# Patient Record
Sex: Female | Born: 1995
Health system: Southern US, Community
[De-identification: ages and names within clinical notes are randomized; demographics above are authoritative.]

## PROBLEM LIST (undated history)

## (undated) DIAGNOSIS — F4310 Post-traumatic stress disorder, unspecified: Secondary | ICD-10-CM

## (undated) DIAGNOSIS — R569 Unspecified convulsions: Secondary | ICD-10-CM

## (undated) DIAGNOSIS — F32A Depression, unspecified: Secondary | ICD-10-CM

## (undated) DIAGNOSIS — G43019 Migraine without aura, intractable, without status migrainosus: Secondary | ICD-10-CM

## (undated) DIAGNOSIS — F329 Major depressive disorder, single episode, unspecified: Secondary | ICD-10-CM

## (undated) HISTORY — DX: Post-traumatic stress disorder, unspecified: F43.10

## (undated) HISTORY — DX: Migraine without aura, intractable, without status migrainosus: G43.019

---

## 2016-08-18 ENCOUNTER — Emergency Department (HOSPITAL_COMMUNITY)
Admission: EM | Admit: 2016-08-18 | Discharge: 2016-08-19 | Disposition: A | Discharge status: Home / Self Care | Payer: Self-pay | Attending: Emergency Medicine | Admitting: Emergency Medicine

## 2016-08-18 ENCOUNTER — Encounter (HOSPITAL_COMMUNITY): Payer: Self-pay | Admitting: Emergency Medicine

## 2016-08-18 DIAGNOSIS — R0602 Shortness of breath: Secondary | ICD-10-CM | POA: Insufficient documentation

## 2016-08-18 DIAGNOSIS — R569 Unspecified convulsions: Secondary | ICD-10-CM | POA: Insufficient documentation

## 2016-08-18 DIAGNOSIS — R7989 Other specified abnormal findings of blood chemistry: Secondary | ICD-10-CM | POA: Insufficient documentation

## 2016-08-18 DIAGNOSIS — R000 Tachycardia, unspecified: Secondary | ICD-10-CM | POA: Insufficient documentation

## 2016-08-18 HISTORY — DX: Major depressive disorder, single episode, unspecified: F32.9

## 2016-08-18 HISTORY — DX: Depression, unspecified: F32.A

## 2016-08-18 LAB — RAPID URINE DRUG SCREEN, HOSP PERFORMED
Amphetamines: NOT DETECTED
Barbiturates: NOT DETECTED
Benzodiazepines: NOT DETECTED
Cocaine: NOT DETECTED
OPIATES: NOT DETECTED
Tetrahydrocannabinol: NOT DETECTED

## 2016-08-18 LAB — CBC WITH DIFFERENTIAL/PLATELET
Basophils Absolute: 0 10*3/uL (ref 0.0–0.1)
Basophils Relative: 0 %
Eosinophils Absolute: 0 10*3/uL (ref 0.0–0.7)
Eosinophils Relative: 0 %
HEMATOCRIT: 36.4 % (ref 36.0–46.0)
HEMOGLOBIN: 11.8 g/dL — AB (ref 12.0–15.0)
LYMPHS ABS: 1 10*3/uL (ref 0.7–4.0)
LYMPHS PCT: 10 %
MCH: 27.1 pg (ref 26.0–34.0)
MCHC: 32.4 g/dL (ref 30.0–36.0)
MCV: 83.7 fL (ref 78.0–100.0)
MONOS PCT: 7 %
Monocytes Absolute: 0.7 10*3/uL (ref 0.1–1.0)
NEUTROS ABS: 8.5 10*3/uL — AB (ref 1.7–7.7)
NEUTROS PCT: 83 %
Platelets: 174 10*3/uL (ref 150–400)
RBC: 4.35 MIL/uL (ref 3.87–5.11)
RDW: 13.4 % (ref 11.5–15.5)
WBC: 10.2 10*3/uL (ref 4.0–10.5)

## 2016-08-18 LAB — URINALYSIS, ROUTINE W REFLEX MICROSCOPIC
BACTERIA UA: NONE SEEN
BILIRUBIN URINE: NEGATIVE
Glucose, UA: NEGATIVE mg/dL
HGB URINE DIPSTICK: NEGATIVE
Ketones, ur: NEGATIVE mg/dL
LEUKOCYTES UA: NEGATIVE
NITRITE: NEGATIVE
PROTEIN: 100 mg/dL — AB
Specific Gravity, Urine: 1.024 (ref 1.005–1.030)
pH: 5 (ref 5.0–8.0)

## 2016-08-18 LAB — BASIC METABOLIC PANEL
Anion gap: 4 — ABNORMAL LOW (ref 5–15)
BUN: 13 mg/dL (ref 6–20)
CHLORIDE: 108 mmol/L (ref 101–111)
CO2: 23 mmol/L (ref 22–32)
CREATININE: 1.15 mg/dL — AB (ref 0.44–1.00)
Calcium: 8.6 mg/dL — ABNORMAL LOW (ref 8.9–10.3)
GFR calc non Af Amer: >60 mL/min (ref >60)
Glucose, Bld: 137 mg/dL — ABNORMAL HIGH (ref 65–99)
Potassium: 3.5 mmol/L (ref 3.5–5.1)
Sodium: 135 mmol/L (ref 135–145)

## 2016-08-18 LAB — PREGNANCY, URINE: Preg Test, Ur: NEGATIVE

## 2016-08-18 MED ORDER — SODIUM CHLORIDE 0.9 % IV BOLUS (SEPSIS)
1000.0000 mL | Freq: Once | INTRAVENOUS | Status: AC
Start: 2016-08-18 — End: 2016-08-18
  Administered 2016-08-18: 1000 mL via INTRAVENOUS

## 2016-08-18 NOTE — ED Provider Notes (Signed)
[facility]-EMERGENCY DEPT Provider Note   CSN: [id number] Arrival date & time: 08/18/16  2009     History   Chief Complaint Chief Complaint  Patient presents with  . Seizures    HPI Jacqueline Castaneda is a 21 y.o. female.  Patient with a history of depression presents with single tonic clonic seizure lasting approximately 5 minutes at about 7:15 tonight. There was a physician among the witnesses who confirmed seizure type and duration. No history of seizure. She does not feel she injured herself. She states her lip is sore but no tongue biting, vomiting or urinary incontinence. There was a post-ictal period per witnesses that has resolved. She reports lingering dizziness.    The history is provided by the patient. No language interpreter was used.  Seizures      Past Medical History:  Diagnosis Date  . Depression     There are no active problems to display for this patient.   History reviewed. No pertinent surgical history.  OB History    No data available       Home Medications    Prior to Admission medications   Not on File    Family History No family history on file.  Social History Social History  Substance Use Topics  . Smoking status: Never Smoker  . Smokeless tobacco: Never Used  . Alcohol use Yes     Comment: not weekly, very occasional     Allergies   Patient has no allergy information on record.   Review of Systems Review of Systems  Constitutional: Negative for chills and fever.  HENT: Negative.   Respiratory: Negative.   Cardiovascular: Negative.   Gastrointestinal: Negative.   Genitourinary: Negative for enuresis.  Musculoskeletal: Negative.   Skin: Negative.   Neurological: Positive for dizziness and seizures.     Physical Exam Updated Vital Signs There were no vitals taken for this visit.  Physical Exam  Constitutional: She is oriented to person, place, and time. She appears well-developed and well-nourished.  HENT:  Head:  Normocephalic.  Neck: Normal range of motion. Neck supple.  Cardiovascular: Normal rate and regular rhythm.   Pulmonary/Chest: Effort normal and breath sounds normal.  Abdominal: Soft. Bowel sounds are normal. There is no tenderness. There is no rebound and no guarding.  Musculoskeletal: Normal range of motion.  Neurological: She is alert and oriented to person, place, and time.  CN's 3-12 grossly intact. Speech is clear and focused. No facial asymmetry. No lateralizing weakness. Reflexes are equal. No deficits of coordination. Ambulatory without imbalance.    Skin: Skin is warm and dry. No rash noted.  Psychiatric: She has a normal mood and affect.     ED Treatments / Results  Labs (all labs ordered are listed, but only abnormal results are displayed) Labs Reviewed - No data to display  EKG  EKG Interpretation None       Radiology No results found.  Procedures Procedures (including critical care time)  Medications Ordered in ED Medications - No data to display   Initial Impression / Assessment and Plan / ED Course  I have reviewed the triage vital signs and the nursing notes.  Pertinent labs & imaging results that were available during my care of the patient were reviewed by me and considered in my medical decision making (see chart for details).     Patient presents with single seizure that occurred PTA with reported post-ictal period that has resolved.   Feel the likely source of seizure is  her use of Wellbutrin - no ETOH, drug use, h/o head injury or h/o seizures in the past. On Wellbutrin x 5 months. Discussed with pharmacist who advises that tapering to stop is not necessary.   She is given 2 liters fluids for soft blood pressure and tachycardia. She denies SOB or pain with breathing tonight. She does endorse periodic SOB. D-dimer slightly elevated. CT angio performed and is negative for PE.   She reports feeling better, less dizzy. She is ambulatory without  imbalance. No further seizure activity. She can be discharged home with return precautions. Will stop Wellbutrin.   Final Clinical Impressions(s) / ED Diagnoses   Final diagnoses:  None   1. Seizure 2. Tachycardia  New Prescriptions New Prescriptions   No medications on file     Elpidio AnisUpstill, Walter Min, Cordelia Poche-C 08/21/16 0017    Alvira MondaySchlossman, Erin, MD 08/21/16 [phone]
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
## 2016-08-19 ENCOUNTER — Emergency Department (HOSPITAL_COMMUNITY): Payer: Self-pay

## 2016-08-19 LAB — D-DIMER, QUANTITATIVE: D-Dimer, Quant: 0.81 ug/mL-FEU — ABNORMAL HIGH (ref 0.00–0.50)

## 2016-08-19 MED ORDER — IOPAMIDOL (ISOVUE-370) INJECTION 76%
INTRAVENOUS | Status: AC
  Administered 2016-08-19: 100 mL
  Filled 2016-08-19: qty 100

## 2016-08-19 NOTE — Discharge Instructions (Signed)
Your studies are negative for any worrisome findings. The seizure you experienced was most likely due to the medication you are taking (Wellbutrin). Stop taking this medication immediately. Follow up with [facility] - or a primary care provider of your choice - for recheck and further management of the condition you were treating with Wellbutrin. Return here as needed.

## 2018-01-21 ENCOUNTER — Emergency Department (HOSPITAL_COMMUNITY): Payer: Self-pay

## 2018-01-21 ENCOUNTER — Other Ambulatory Visit: Payer: Self-pay

## 2018-01-21 ENCOUNTER — Emergency Department (HOSPITAL_COMMUNITY)
Admission: EM | Admit: 2018-01-21 | Discharge: 2018-01-21 | Disposition: A | Discharge status: Home / Self Care | Payer: Self-pay | Attending: Emergency Medicine | Admitting: Emergency Medicine

## 2018-01-21 ENCOUNTER — Encounter (HOSPITAL_COMMUNITY): Payer: Self-pay | Admitting: Emergency Medicine

## 2018-01-21 DIAGNOSIS — R0602 Shortness of breath: Secondary | ICD-10-CM | POA: Insufficient documentation

## 2018-01-21 DIAGNOSIS — R202 Paresthesia of skin: Secondary | ICD-10-CM | POA: Insufficient documentation

## 2018-01-21 DIAGNOSIS — R42 Dizziness and giddiness: Secondary | ICD-10-CM | POA: Insufficient documentation

## 2018-01-21 DIAGNOSIS — R079 Chest pain, unspecified: Secondary | ICD-10-CM | POA: Insufficient documentation

## 2018-01-21 HISTORY — DX: Unspecified convulsions: R56.9

## 2018-01-21 LAB — BASIC METABOLIC PANEL
Anion gap: 9 (ref 5–15)
BUN: 13 mg/dL (ref 6–20)
CO2: 26 mmol/L (ref 22–32)
Calcium: 9.9 mg/dL (ref 8.9–10.3)
Chloride: 103 mmol/L (ref 98–111)
Creatinine, Ser: 1 mg/dL (ref 0.44–1.00)
GFR calc non Af Amer: >60 mL/min (ref >60)
Glucose, Bld: 89 mg/dL (ref 70–99)
POTASSIUM: 3.8 mmol/L (ref 3.5–5.1)
SODIUM: 138 mmol/L (ref 135–145)

## 2018-01-21 LAB — CBC
HEMATOCRIT: 42.4 % (ref 36.0–46.0)
HEMOGLOBIN: 13.1 g/dL (ref 12.0–15.0)
MCH: 26 pg (ref 26.0–34.0)
MCHC: 30.9 g/dL (ref 30.0–36.0)
MCV: 84.3 fL (ref 80.0–100.0)
NRBC: 0 % (ref 0.0–0.2)
Platelets: 336 10*3/uL (ref 150–400)
RBC: 5.03 MIL/uL (ref 3.87–5.11)
RDW: 13.2 % (ref 11.5–15.5)
WBC: 6.2 10*3/uL (ref 4.0–10.5)

## 2018-01-21 LAB — POCT I-STAT TROPONIN I: Troponin i, poc: 0 ng/mL (ref 0.00–0.08)

## 2018-01-21 LAB — I-STAT BETA HCG BLOOD, ED (NOT ORDERABLE)

## 2018-01-21 NOTE — ED Notes (Signed)
Pt left without d/c papers. She ambulated out of the ED in no distress. Pt discharged by provider.

## 2018-01-21 NOTE — Discharge Instructions (Addendum)
Your evaluation for chest pain all came up normal today. We are unsure why you had this episode today. Please discuss with your PCP or return to the emergency department if it occurs again. If you do not have a PCP, you can go to our [facility] and [facility].  Below are 2 neuro offices that you can follow-up with since you moved back to the area.  Thank you for allowing [place] to take care of you today.

## 2018-01-21 NOTE — ED Provider Notes (Signed)
[facility]-EMERGENCY DEPT Provider Note  CSN: [id number] Arrival date & time: 01/21/18  1404    History   Chief Complaint Chief Complaint  Patient presents with  . Seizures  . Chest Pain    HPI Jacqueline Castaneda is a 22 y.o. female with a medical history of seizures who presented to the ED for chest pain.   Chest Pain   This is a new problem. The current episode started 6 to 12 hours ago. The problem has been resolved. Associated with: nothing. She reports acute onset while at work. Denies any physical or emotional stress. Pain location: midsternal. The pain is moderate. The quality of the pain is described as heavy and pressure-like. The pain does not radiate. Episode Length: Unable to quantify. States it has resolved greatly before coming to the ED. Exacerbated by: none. Associated symptoms include dizziness, numbness (Bilateral arm pain) and shortness of breath. Pertinent negatives include no abdominal pain, no back pain, no cough, no diaphoresis, no exertional chest pressure, no fever, no headaches, no hemoptysis, no leg pain, no lower extremity edema, no nausea, no near-syncope, no palpitations, no sputum production, no syncope and no weakness. She has tried nothing for the symptoms. Risk factors: Denies EtOH use, substance use, sedentary lifestyle, OCPs or stress.  Her past medical history is significant for seizures.  Her family medical history is significant for heart disease and hypertension.  Pertinent negatives for family medical history include: no sudden death and no TIA.    Past Medical History:  Diagnosis Date  . Depression   . Seizures (HCC)     There are no active problems to display for this patient.   History reviewed. No pertinent surgical history.   OB History   None      Home Medications    Prior to Admission medications   Not on File    Family History No family history on file.  Social History Social History   Tobacco Use    . Smoking status: Never Smoker  . Smokeless tobacco: Never Used  Substance Use Topics  . Alcohol use: Yes    Comment: not weekly, very occasional  . Drug use: No     Allergies   Patient has no known allergies.   Review of Systems Review of Systems  Constitutional: Negative for diaphoresis and fever.  Respiratory: Positive for shortness of breath. Negative for cough, hemoptysis and sputum production.   Cardiovascular: Positive for chest pain. Negative for palpitations, syncope and near-syncope.  Gastrointestinal: Negative for abdominal pain and nausea.  Musculoskeletal: Negative for back pain.  Neurological: Positive for dizziness, seizures and numbness (Bilateral arm pain). Negative for weakness and headaches.  Psychiatric/Behavioral: Negative.   All other systems reviewed and are negative.  Physical Exam Updated Vital Signs BP 133/82 (BP Location: Left Arm)   Pulse 100   Temp 98.1 F (36.7 C) (Oral)   Resp 16   Ht 5\' 1"  (1.549 m)   Wt 70 kg   LMP 01/14/2018   SpO2 100%   BMI 29.17 kg/m   Physical Exam  Constitutional: She is oriented to person, place, and time. She appears well-developed and well-nourished.  HENT:  Head: Normocephalic and atraumatic.  Eyes: Pupils are equal, round, and reactive to light. EOM are normal.  Neck: Normal range of motion. Neck supple. Normal carotid pulses present. Carotid bruit is not present.  Cardiovascular: Normal rate, regular rhythm, normal heart sounds, intact distal pulses and normal pulses.  No murmur heard. Pulmonary/Chest:  Effort normal and breath sounds normal.  Abdominal: Soft. Normal appearance and bowel sounds are normal. There is no tenderness.  Neurological: She is alert and oriented to person, place, and time. She has normal strength and normal reflexes. No cranial nerve deficit or sensory deficit. She displays a negative Romberg sign. Coordination and gait normal.  Skin: Skin is warm and intact. Capillary refill takes  less than 2 seconds. She is not diaphoretic.  Nursing note and vitals reviewed.    ED Treatments / Results  Labs (all labs ordered are listed, but only abnormal results are displayed) Labs Reviewed  BASIC METABOLIC PANEL  CBC  I-STAT TROPONIN, ED  I-STAT BETA HCG BLOOD, ED ([facility], [facility], [facility] ONLY)  POCT I-STAT TROPONIN I  I-STAT BETA HCG BLOOD, ED (NOT ORDERABLE)    EKG EKG Interpretation  Date/Time:  Wednesday January 21 2018 18:23:12 EDT Ventricular Rate:  94 PR Interval:    QRS Duration: 75 QT Interval:  369 QTC Calculation: 462 R Axis:   78 Text Interpretation:  Sinus rhythm no acute ST/T changes similar to earlier in the day and may 2018 Confirmed by Pricilla Loveless [phone]) on 01/21/2018 6:35:24 PM   Radiology Dg Chest 2 View  Result Date: 01/21/2018 CLINICAL DATA:  Chest pain.  Recent seizure. EXAM: CHEST - 2 VIEW COMPARISON:  Chest CTA dated 08/19/2016. FINDINGS: The heart size and mediastinal contours are within normal limits. Both lungs are clear. The visualized skeletal structures are unremarkable. IMPRESSION: Normal examination. Electronically Signed   By: Beckie Salts M.D.   On: 01/21/2018 15:39    Procedures Procedures (including critical care time)  Medications Ordered in ED Medications - No data to display   Initial Impression / Assessment and Plan / ED Course  Triage vital signs and the nursing notes have been reviewed.  Pertinent labs & imaging results that were available during care of the patient were reviewed and considered in medical decision making (see chart for details).  Patient presents to the ED for an acute episode of chest pain that began while she was at work. She denies any physical exertion, emotional/mental stress or other triggers to this episode. Associated symptoms of SOB, paresthesias and dizziness have resolved since then. Physical exam is unremarkable. No family history of congenital heart disease or premature cardiac death. Low  suspicion for acute cardiac event given clinical presentation and HEART score of 1. Will proceed with typical cardiac work-up. Clinical Course as of Jan 21 1910  Wed Jan 21, 2018  1910 CXR normal.  Labs unremarkable. Negative troponin. Patient requested discharge prior to EKG being done. Comfortable with discharge given clinical presentation and labs so far.   [GM]    Clinical Course User Index [GM] Sheppard Luckenbach, Sharyon Medicus, PA-C     Final Clinical Impressions(s) / ED Diagnoses  1. Chest Pain. Advised to follow-up with PCP to see if cardiology referral is necessary. Education on s/s that warrant sooner medical follow-up.  Dispo: Home. After thorough clinical evaluation, this patient is determined to be medically stable and can be safely discharged with the previously mentioned treatment and/or outpatient follow-up/referral(s). At this time, there are no other apparent medical conditions that require further screening, evaluation or treatment.   Final diagnoses:  Chest pain, unspecified type    ED Discharge Orders    None        Windy Carina, [place] 01/21/18 1912    Pricilla Loveless, MD 01/22/18 0005

## 2018-01-21 NOTE — ED Notes (Signed)
Refused signature 

## 2018-01-21 NOTE — ED Notes (Signed)
RN informed ED Provider that patient does not and is not going to be discharged and would like to speak to an MD and not a PA.

## 2018-01-21 NOTE — ED Triage Notes (Signed)
Pt reports PMH seizures and been having them back to back for 3 days. Reports taking medications as prescribed. Repots chest pains that started today.

## 2018-04-02 ENCOUNTER — Emergency Department: Admission: EM | Admit: 2018-04-02 | Discharge: 2018-04-02 | Discharge status: Absent without leave | Payer: Self-pay

## 2018-04-10 ENCOUNTER — Telehealth: Payer: Self-pay | Admitting: Family Medicine

## 2018-04-10 NOTE — Telephone Encounter (Signed)
OK to schedule at 2  Copied from CRM [id number]. Topic: General - Other >> Apr 10, 2018  9:59 AM Elliot Gault wrote: [name]  Relation to pt: mother Call back number:  [phone]   Reason for call:  Jacqueline Castaneda  [phone] [employer] employee referred  by 2 colleagues to establish care with Roosvelt Maser, PA. Mother would like her daughter to establish care but due to PCP preference unable to schedule back to back NPA. Mother appointment is scheduled for 04/27/2018 at 1:30pm, requesting if daughter can be scheduled directly after at 2pm, please advise mother directly.

## 2018-04-13 NOTE — Telephone Encounter (Signed)
Scheduled appt per Fleet Contras but unable to reach [place] at the number given.

## 2018-04-27 ENCOUNTER — Ambulatory Visit: Payer: Self-pay | Admitting: Family Medicine

## 2018-04-29 ENCOUNTER — Encounter: Payer: Self-pay | Admitting: Family Medicine

## 2018-04-29 ENCOUNTER — Ambulatory Visit (INDEPENDENT_AMBULATORY_CARE_PROVIDER_SITE_OTHER): Payer: Self-pay | Admitting: Family Medicine

## 2018-04-29 VITALS — BP 118/82 | HR 102 | Resp 17 | Ht 63.0 in | Wt 149.6 lb

## 2018-04-29 DIAGNOSIS — Z3202 Encounter for pregnancy test, result negative: Secondary | ICD-10-CM

## 2018-04-29 DIAGNOSIS — G40909 Epilepsy, unspecified, not intractable, without status epilepticus: Secondary | ICD-10-CM

## 2018-04-29 DIAGNOSIS — F411 Generalized anxiety disorder: Secondary | ICD-10-CM

## 2018-04-29 DIAGNOSIS — F331 Major depressive disorder, recurrent, moderate: Secondary | ICD-10-CM

## 2018-04-29 DIAGNOSIS — Z7689 Persons encountering health services in other specified circumstances: Secondary | ICD-10-CM

## 2018-04-29 LAB — POCT URINE PREGNANCY: PREG TEST UR: NEGATIVE

## 2018-04-29 MED ORDER — LAMOTRIGINE 100 MG PO TABS: 100.0000 mg | ORAL_TABLET | Freq: Two times a day (BID) | ORAL | 1 refills | Status: DC

## 2018-04-29 MED ORDER — BUPROPION HCL ER (XL) 300 MG PO TB24: 300.0000 mg | ORAL_TABLET | Freq: Every day | ORAL | 3 refills | Status: DC

## 2018-04-29 MED ORDER — HYDROXYZINE HCL 50 MG PO TABS: 50.0000 mg | ORAL_TABLET | Freq: Three times a day (TID) | ORAL | 1 refills | Status: DC
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
## 2018-04-29 NOTE — Patient Instructions (Addendum)
Thank you for choosing Primary Care at [facility] to be your medical home!    Jacqueline Castaneda was seen by Joaquin Courts, FNP today.   Jacqueline Castaneda's primary care provider is Bing Neighbors, FNP.   For the best care possible, you should try to see Joaquin Courts, FNP-C whenever you come to the clinic.   We look forward to seeing you again soon!  If you have any questions about your visit today, please call us at [phone] or feel free to reach your primary care provider via MyChart.     Local mental health resources: [facility] [place], [place] [phone]  [facility]-Address: [address], [place], [place] [address] Phone: [phone]  [facility] [place], [place] [phone]   Contact the National Suicide Prevention Hotline 860 610 7847 if you at any time you are experiencing thoughts or feelings of suicide.  Work[employer].at via smart phone or computer . By phone: ([phone] [phone] or [phone]    Generalized Anxiety Disorder, Adult Generalized anxiety disorder (GAD) is a mental health disorder. People with this condition constantly worry about everyday events. Unlike normal anxiety, worry related to GAD is not triggered by a specific event. These worries also do not fade or get better with time. GAD interferes with life functions, including relationships, work, and school. GAD can vary from mild to severe. People with severe GAD can have intense waves of anxiety with physical symptoms (panic attacks). What are the causes? The exact cause of GAD is not known. What increases the risk? This condition is more likely to develop in:  Women.  People who have a family history of anxiety disorders.  People who are very shy.  People who experience very stressful life events, such as the death of a loved one.  People who have a very stressful family environment. What are the signs or  symptoms? People with GAD often worry excessively about many things in their lives, such as their health and family. They may also be overly concerned about:  Doing well at work.  Being on time.  Natural disasters.  Friendships. Physical symptoms of GAD include:  Fatigue.  Muscle tension or having muscle twitches.  Trembling or feeling shaky.  Being easily startled.  Feeling like your heart is pounding or racing.  Feeling out of breath or like you cannot take a deep breath.  Having trouble falling asleep or staying asleep.  Sweating.  Nausea, diarrhea, or irritable bowel syndrome (IBS).  Headaches.  Trouble concentrating or remembering facts.  Restlessness.  Irritability. How is this diagnosed? Your health care provider can diagnose GAD based on your symptoms and medical history. You will also have a physical exam. The health care provider will ask specific questions about your symptoms, including how severe they are, when they started, and if they come and go. Your health care provider may ask you about your use of alcohol or drugs, including prescription medicines. Your health care provider may refer you to a mental health specialist for further evaluation. Your health care provider will do a thorough examination and may perform additional tests to rule out other possible causes of your symptoms. To be diagnosed with GAD, a person must have anxiety that:  Is out of his or her control.  Affects several different aspects of his or her life, such as work and relationships.  Causes distress that makes him or her unable to take part in normal activities.  Includes at  least three physical symptoms of GAD, such as restlessness, fatigue, trouble concentrating, irritability, muscle tension, or sleep problems. Before your health care provider can confirm a diagnosis of GAD, these symptoms must be present more days than they are not, and they must last for six months or  longer. How is this treated? The following therapies are usually used to treat GAD:  Medicine. Antidepressant medicine is usually prescribed for long-term daily control. Antianxiety medicines may be added in severe cases, especially when panic attacks occur.  Talk therapy (psychotherapy). Certain types of talk therapy can be helpful in treating GAD by providing support, education, and guidance. Options include: ? Cognitive behavioral therapy (CBT). People learn coping skills and techniques to ease their anxiety. They learn to identify unrealistic or negative thoughts and behaviors and to replace them with positive ones. ? Acceptance and commitment therapy (ACT). This treatment teaches people how to be mindful as a way to cope with unwanted thoughts and feelings. ? Biofeedback. This process trains you to manage your body's response (physiological response) through breathing techniques and relaxation methods. You will work with a therapist while machines are used to monitor your physical symptoms.  Stress management techniques. These include yoga, meditation, and exercise. A mental health specialist can help determine which treatment is best for you. Some people see improvement with one type of therapy. However, other people require a combination of therapies. Follow these instructions at home:  Take over-the-counter and prescription medicines only as told by your health care provider.  Try to maintain a normal routine.  Try to anticipate stressful situations and allow extra time to manage them.  Practice any stress management or self-calming techniques as taught by your health care provider.  Do not punish yourself for setbacks or for not making progress.  Try to recognize your accomplishments, even if they are small.  Keep all follow-up visits as told by your health care provider. This is important. Contact a health care provider if:  Your symptoms do not get better.  Your symptoms  get worse.  You have signs of depression, such as: ? A persistently sad, cranky, or irritable mood. ? Loss of enjoyment in activities that used to bring you joy. ? Change in weight or eating. ? Changes in sleeping habits. ? Avoiding friends or family members. ? Loss of energy for normal tasks. ? Feelings of guilt or worthlessness. Get help right away if:  You have serious thoughts about hurting yourself or others. If you ever feel like you may hurt yourself or others, or have thoughts about taking your own life, get help right away. You can go to your nearest emergency department or call:  Your local emergency services (911 in the [place]  A suicide crisis helpline, such as the National Suicide Prevention Lifeline at (603)497-8834. This is open 24 hours a day. Summary  Generalized anxiety disorder (GAD) is a mental health disorder that involves worry that is not triggered by a specific event.  People with GAD often worry excessively about many things in their lives, such as their health and family.  GAD may cause physical symptoms such as restlessness, trouble concentrating, sleep problems, frequent sweating, nausea, diarrhea, headaches, and trembling or muscle twitching.  A mental health specialist can help determine which treatment is best for you. Some people see improvement with one type of therapy. However, other people require a combination of therapies. This information is not intended to replace advice given to you by your health care provider. Make sure  you discuss any questions you have with your health care provider. Document Released: 07/06/2012 Document Revised: 01/30/2016 Document Reviewed: 01/30/2016 Elsevier Interactive Patient Education  2019 [employer]vier Inc[employer]   Living With Post-Traumatic Stress Disorder If you have been diagnosed with post-traumatic stress disorder (PTSD), you may be relieved that you now know why you have felt or behaved a certain way. Still, you may  feel overwhelmed about the treatment ahead. You may also wonder how to get the support you need and how to deal with the condition day-to-day. If you are living with PTSD, there are ways to help you recover from it and manage your symptoms. How to manage lifestyle changes Managing stress Stress is your body's reaction to life changes and events, both good and bad. Stress can make PTSD worse. Take the following steps to cope with stress:  Talk with your health care provider or a counselor if you would like to learn more about techniques to reduce your stress. He or she may suggest some stress reduction techniques such as: ? Muscle relaxation exercises. ? Regular exercise. ? Meditation, yoga, or other mind-body exercises. ? Breathing exercises. ? Listening to quiet music. ? Spending time outside.  Maintain a healthy lifestyle. Eat a healthy diet, exercise regularly, get plenty of sleep, and take time to relax.  Spend time with others. Talk with them about how you are feeling and what kind of support you need. Try to not isolate yourself, even though you may feel like doing that. Isolating yourself can delay your recovery.  Do activities and hobbies that you enjoy.  Pace yourself when doing stressful things. Take breaks, and reward yourself when you finish. Make sure that you do not overload your schedule.  Medicines Your health care provider may suggest certain medicines if he or she feels that they will help to improve your condition. Antidepressants or antipsychotic medicines may be used to treat PTSD. Avoid using alcohol and other substances that may prevent your medicines from working properly (may interact). It is also important to:  Talk with your pharmacist or health care provider about all medicines that you take, their possible side effects, and which medicines are safe to take together.  Make it your goal to take part in all treatment decisions (shared decision-making). Ask about  possible side effects of medicines that your health care provider recommends, and tell him or her how you feel about having those side effects. It is best if shared decision-making with your health care provider is part of your total treatment plan. If your health care provider prescribes a medicine, you may not notice the full benefits of it for 4-8 weeks. Most people who are treated for PTSD need to take medicine for at least 6-12 months after they feel better. If you are taking medicines as part of your treatment, do not stop taking medicines before you ask your health care provider if it is safe to stop. You may need to have the medicine slowly decreased (tapered) over time to lower the risk of harmful side effects. Relationships Many people who have PTSD have difficulty trusting others. Make an effort to:  Take risks and develop trust with close friends and family members. Developing trust in others can help you feel safe and connect you with emotional support.  Be open and honest about your feelings.  Try to have fun and relax in safe spaces, such as with friends and family.  Think about going to couples counseling, family education classes, or family  therapy. Your loved ones may not always know how to be supportive. Therapy can be helpful for everyone. How to recognize changes in your condition Be aware of your symptoms and how often you have them. The following symptoms mean that you need to seek help for your PTSD:  You feel suspicious and angry.  You have repeated flashbacks.  You avoid going out or being with others.  You have an increasing number of fights with close friends or family members, such as your spouse.  You have thoughts about hurting yourself or others.  You cannot get relief from feelings of depression or anxiety. Where to find support Talking to others  Explain that PTSD is a mental health problem. It is something that a person can develop after experiencing or  seeing a life-threatening event. Tell them that PTSD makes you feel stress like you did during the event.  Talk to your loved ones about the symptoms you have. Also tell them what things or situations can cause symptoms to start (are triggers for you).  Assure your loved ones that there are treatments to help PTSD. Discuss possibly seeking family therapy or couples therapy.  If you are worried or fearful about seeking treatment, ask for support. Finances Not all insurance plans cover mental health care, so it is important to check with your insurance carrier. If paying for co-pays or counseling services is a problem, search for a local or county mental health care center. Public mental health care services may be offered there at a low cost or no cost when you are not able to see a private health care provider. If you are a veteran, contact a local veterans organization or veterans hospital for more information. If you are taking medicine for PTSD, you may be able to get the genericform, which may be less expensive than brand-name medicine. Some makers of prescription medicines also offer help to patients who cannot afford the medicines that they need. Community resources  Find a support group in your community. Often, groups are available for [employer] and Companymilitary veterans, trauma victims, and family members or caregivers.  Look into volunteer opportunities. Taking part in these can help you feel more connected to your community.  Contact a local organization to find out if you are eligible for a service dog.  Keep daily contact with at least one trusted friend or family member. Follow these instructions at home: Lifestyle  Exercise regularly. Try to do 30 or more minutes of physical activity on most days of the week.  Try to get 7-9 hours of sleep each night. To help with sleep: ? Keep your bedroom cool and dark. ? Do not eat a heavy meal during the hour before you go to bed. ? Do not drink alcohol  or caffeinated drinks before bed. ? Avoid screen time before bedtime. This means avoiding use of your TV, computer, tablet, and cell phone.  Avoid using alcohol or drugs.  Practice self-soothing skills and use them daily.  Try to have fun and seek humor in your life. General instructions  If your PTSD is affecting your marriage or family, seek help from a family therapist.  Take over-the-counter and prescription medicines only as told by your health care provider.  Make sure to let all of your health care providers know that you have PTSD. This is especially important if you are having surgery or need to be admitted to the hospital.  Keep all follow-up visits as told by your health care providers.  This is important. Where to find more information Go to this website to find more information about PTSD, treatment for PTSD, and how to get support:  [facility] for PTSD: www.ptsd.FitBoxer.tn Contact a health care provider if:  Your symptoms get worse or they do not get better. Get help right away if:  You have thoughts about hurting yourself or others. If you ever feel like you may hurt yourself or others, or have thoughts about taking your own life, get help right away. You can go to your nearest emergency department or call:  Your local emergency services (911 in the [place]  A suicide crisis helpline, such as the National Suicide Prevention Lifeline at (517)398-7040. This is open 24-hours a day. Summary  If you are living with PTSD, there are ways to help you recover from it and manage your symptoms.  Find supportive environments and people who understand PTSD. Spend time in those places, and maintain contact with those people.  Work with your health care team to create a management plan that includes counseling, stress management techniques, and healthy lifestyle habits. This information is not intended to replace advice given to you by your health care provider. Make sure you  discuss any questions you have with your health care provider. Document Released: 07/11/2016 Document Revised: 07/11/2016 Document Reviewed: 07/11/2016 Elsevier Interactive Patient Education  2019 [employer].

## 2018-05-01 ENCOUNTER — Telehealth: Payer: Self-pay | Admitting: Family Medicine

## 2018-05-01 ENCOUNTER — Encounter: Payer: Self-pay | Admitting: Family Medicine

## 2018-05-01 LAB — THYROID PANEL WITH TSH
Free Thyroxine Index: 1.5 (ref 1.2–4.9)
T3 Uptake Ratio: 30 % (ref 24–39)
T4, Total: 5.1 ug/dL (ref 4.5–12.0)
TSH: 1.71 u[IU]/mL (ref 0.450–4.500)

## 2018-05-01 LAB — COMPREHENSIVE METABOLIC PANEL
ALT: 9 IU/L (ref 0–32)
AST: 14 IU/L (ref 0–40)
Albumin/Globulin Ratio: 2.4 — ABNORMAL HIGH (ref 1.2–2.2)
Albumin: 4.8 g/dL (ref 3.9–5.0)
Alkaline Phosphatase: 53 IU/L (ref 39–117)
BUN/Creatinine Ratio: 10 (ref 9–23)
BUN: 10 mg/dL (ref 6–20)
Bilirubin Total: 0.2 mg/dL (ref 0.0–1.2)
CO2: 22 mmol/L (ref 20–29)
Calcium: 9.4 mg/dL (ref 8.7–10.2)
Chloride: 104 mmol/L (ref 96–106)
Creatinine, Ser: 1 mg/dL (ref 0.57–1.00)
GFR calc Af Amer: 92 mL/min/{1.73_m2} (ref >59)
GFR calc non Af Amer: 80 mL/min/{1.73_m2} (ref >59)
GLOBULIN, TOTAL: 2 g/dL (ref 1.5–4.5)
Glucose: 82 mg/dL (ref 65–99)
Potassium: 3.9 mmol/L (ref 3.5–5.2)
SODIUM: 141 mmol/L (ref 134–144)
Total Protein: 6.8 g/dL (ref 6.0–8.5)

## 2018-05-01 LAB — LAMOTRIGINE LEVEL: Lamotrigine Lvl: 10.1 ug/mL (ref 2.0–20.0)

## 2018-05-01 NOTE — Telephone Encounter (Signed)
Patient responded via MyChart. Will let PCP handle.

## 2018-05-01 NOTE — Telephone Encounter (Signed)
Left voice mail to call back 

## 2018-05-01 NOTE — Telephone Encounter (Signed)
Contact patient via phone regarding My Chart message to discontinue Wellbutrin and start Venlafaxine. I am pending the medication. Wasn't sure which pharmacy to send medication to and patient hasn't responded to My Chart message.

## 2018-05-05 ENCOUNTER — Ambulatory Visit (INDEPENDENT_AMBULATORY_CARE_PROVIDER_SITE_OTHER): Payer: Self-pay | Admitting: Licensed Clinical Social Worker

## 2018-05-05 DIAGNOSIS — Z599 Problem related to housing and economic circumstances, unspecified: Secondary | ICD-10-CM

## 2018-05-05 DIAGNOSIS — Z598 Other problems related to housing and economic circumstances: Secondary | ICD-10-CM

## 2018-05-05 DIAGNOSIS — F331 Major depressive disorder, recurrent, moderate: Secondary | ICD-10-CM

## 2018-05-05 DIAGNOSIS — F419 Anxiety disorder, unspecified: Secondary | ICD-10-CM
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
## 2018-05-13 ENCOUNTER — Ambulatory Visit: Payer: Self-pay | Admitting: Diagnostic Neuroimaging

## 2018-06-04 ENCOUNTER — Encounter: Payer: Self-pay | Admitting: Family Medicine

## 2018-06-05 ENCOUNTER — Encounter: Payer: Self-pay | Admitting: Family Medicine

## 2018-06-05 MED ORDER — LAMOTRIGINE 100 MG PO TABS: 100.0000 mg | ORAL_TABLET | Freq: Two times a day (BID) | ORAL | 0 refills | Status: DC

## 2018-06-05 NOTE — Telephone Encounter (Signed)
Refilled Lamictal x 1 month without refills. See Mychart encounter. Patient requires follow-up with neurology.

## 2018-07-07 ENCOUNTER — Telehealth: Payer: Self-pay

## 2018-07-07 ENCOUNTER — Encounter: Payer: Self-pay | Admitting: Family Medicine

## 2018-07-07 NOTE — Telephone Encounter (Signed)
I attempted to reach the pt in regards to her 4/20 appt scheduled with Dr. Anne Hahn. Trying to determine if we could complete a VV.

## 2018-07-09 NOTE — Telephone Encounter (Signed)
I contacted the pt and left a vm requesting a call back from the pt to complete pre charting for 07/13/18 virtual video visit.

## 2018-07-13 ENCOUNTER — Encounter: Payer: Self-pay | Admitting: Neurology

## 2018-07-13 ENCOUNTER — Ambulatory Visit (INDEPENDENT_AMBULATORY_CARE_PROVIDER_SITE_OTHER): Payer: Self-pay | Admitting: Neurology

## 2018-07-13 ENCOUNTER — Other Ambulatory Visit: Payer: Self-pay

## 2018-07-13 DIAGNOSIS — G43019 Migraine without aura, intractable, without status migrainosus: Secondary | ICD-10-CM

## 2018-07-13 DIAGNOSIS — G40909 Epilepsy, unspecified, not intractable, without status epilepticus: Secondary | ICD-10-CM | POA: Insufficient documentation

## 2018-07-13 HISTORY — DX: Migraine without aura, intractable, without status migrainosus: G43.019

## 2018-07-13 MED ORDER — LAMOTRIGINE 150 MG PO TABS: 150.0000 mg | ORAL_TABLET | Freq: Every day | ORAL | 3 refills | Status: DC

## 2018-07-13 NOTE — Progress Notes (Addendum)
    Virtual Visit via Video Note  I connected with Jacqueline Castaneda on 07/13/18 at  8:30 AM EDT by a video enabled telemedicine application and verified that I am speaking with the correct person using two identifiers.   I discussed the limitations of evaluation and management by telemedicine and the availability of in person appointments. The patient expressed understanding and agreed to proceed.  History of Present Illness: Jacqueline Castaneda is a 23 year old right-handed black female with a history of seizures that began on 18 Aug 2016.  The patient was on Wellbutrin at that time, she does have a significant issue with depression and posttraumatic stress disorder, schizoaffective disorder.  The patient does have a strong family history of seizures in a maternal grandfather and the mother.  The patient apparently had a seizure work-up in [place], she claims that she had MRI at [facility], she also had an EEG study but was told that this was unremarkable.  She apparently has had 2 generalized tonic-clonic seizures, and now is having frequent focal seizures that are associated with zoning out and some jerking of the right hand.  The patient does not operate a motor vehicle.  The patient may have up to 20 seizures a day.  She has not undergone video EEG monitoring.  She reports no problems with significant numbness or weakness of extremities, she does have migraine headaches that occur about every 3 days, she takes Excedrin Migraine for these.  She reports no balance problems or difficulty controlling bowels or bladder.  She does not bite her tongue or lose control of the bowels or bladder with her seizures.  The patient is on Lamictal and fairly low-dose taking 100 mg twice daily.  She currently has no medical insurance, she is referred by her primary care physician for further evaluation.   Observations/Objective: WebEx evaluation reveals that the patient is alert and cooperative.  She has normal speech  pattern, full extraocular movements are seen.  Speech is well enunciated without aphasia or dysarthria.  The patient has good finger-nose-finger and heel shin bilaterally.  Gait is normal.  Tandem gait normal.  Romberg is negative.  Assessment and Plan: 1.  History of seizures  2.  History of anxiety and depression  3.  History of migraine headache  The patient reports having very frequent seizures.  The possibility of pseudoseizures does need to be considered.  The patient will undergo an EEG study, she will be increased on her Lamictal taking 100 mg in the morning and 150 mg in the evening for 2 weeks and then go to 150 mg twice daily.  If the seizures continue, she is to contact our office and we will increase the Lamictal.  We may consider addition of Topamax in the future to help her migraine and her seizures.  She will follow-up in about 3 months.  We will need to get the MRI report that was done in [place].  Follow Up Instructions: 44-month follow-up, may see nurse practitioner   I discussed the assessment and treatment plan with the patient. The patient was provided an opportunity to ask questions and all were answered. The patient agreed with the plan and demonstrated an understanding of the instructions.   The patient was advised to call back or seek an in-person evaluation if the symptoms worsen or if the condition fails to improve as anticipated.  I provided 30 minutes of non-face-to-face time during this encounter.   York Spaniel, MD

## 2018-07-16 ENCOUNTER — Telehealth: Payer: Self-pay | Admitting: Neurology

## 2018-07-16 NOTE — Telephone Encounter (Signed)
I was able to get the report from [facility] system, the patient had a CT scan of the brain, not MRI of the brain.  MRI brain will need to be done with and without gadolinium enhancement in the future.  The impression of the CT was no acute intracranial abnormality.  This was done on 20 April 2017.  The study was done without contrast only.

## 2018-08-01 ENCOUNTER — Encounter: Payer: Self-pay | Admitting: Family Medicine

## 2018-08-03 ENCOUNTER — Encounter: Payer: Self-pay | Admitting: Family Medicine

## 2018-08-03 MED ORDER — BUPROPION HCL ER (XL) 150 MG PO TB24: 150.0000 mg | ORAL_TABLET | Freq: Every day | ORAL | 0 refills | Status: DC

## 2018-08-03 NOTE — Telephone Encounter (Signed)
See My chart message for details. Decreased Wellbutrin 150 mg once daily.

## 2018-09-16 ENCOUNTER — Telehealth: Payer: Self-pay

## 2018-09-16 NOTE — Telephone Encounter (Signed)
Patient stated that after Dr. Jannifer Franklin increased her dose she hasn't had any more seizures or siezure related events and did not want to continue with the EEG since the increase in medication seemed to be working.

## 2018-09-16 NOTE — Telephone Encounter (Signed)
I called the patient, left a message, the patient apparently does not wish to have the EEG study done.  If the seizure type episodes begin to recur, we can reorder the study then.

## 2018-10-05 ENCOUNTER — Other Ambulatory Visit: Payer: Self-pay

## 2018-10-05 ENCOUNTER — Other Ambulatory Visit: Payer: Self-pay | Admitting: Neurology

## 2018-10-05 MED ORDER — LAMOTRIGINE 150 MG PO TABS: 150.0000 mg | ORAL_TABLET | Freq: Two times a day (BID) | ORAL | 3 refills | Status: DC

## 2018-10-05 MED ORDER — LAMOTRIGINE 200 MG PO TABS: 200.0000 mg | ORAL_TABLET | Freq: Two times a day (BID) | ORAL | 3 refills | Status: DC

## 2018-10-05 MED FILL — lamoTRIgine 200 MG TABS: 200 | 30 days supply | Qty: 60 | Fill #0

## 2018-10-12 ENCOUNTER — Ambulatory Visit: Payer: Self-pay | Admitting: Neurology

## 2018-10-26 ENCOUNTER — Encounter: Payer: Self-pay | Admitting: Neurology

## 2018-11-01 NOTE — Progress Notes (Deleted)
PATIENT: Jacqueline Castaneda DOB: 08/17/1995  REASON FOR VISIT: follow up HISTORY FROM: patient  HISTORY OF PRESENT ILLNESS: Today 11/01/18 Jacqueline Castaneda is a 23 year old female with history of seizures since May 2018.  At that time she was taking Wellbutrin.  She has history of depression, PTSD, and schizoaffective disorder.  She reports she has had 2 generalized tonic-clonic seizures, continue to have daily focal seizures associated with zoning out and some jerking of the right hand.  She decided not to undergo EEG at this office.  Recently her Lamictal was increased 200 mg twice a day   HISTORY 07/13/2018 Dr. Anne Castaneda: Jacqueline Castaneda is a 23 year old right-handed black female with a history of seizures that began on 18 Aug 2016.  The patient was on Wellbutrin at that time, she does have a significant issue with depression and posttraumatic stress disorder, schizoaffective disorder.  The patient does have a strong family history of seizures in a maternal grandfather and the mother.  The patient [place], she claims that she had MRI [facility], she also had an EEG study but was told that this was unremarkable.  She apparently has had 2 generalized tonic-clonic seizures, and now is having frequent focal seizures that are associated with zoning out and some jerking of the right hand.  The patient does not operate a motor vehicle.  The patient may have up to 20 seizures a day.  She has not undergone video EEG monitoring.  She reports no problems with significant numbness or weakness of extremities, she does have migraine headaches that occur about every 3 days, she takes Excedrin Migraine for these.  She reports no balance problems or difficulty controlling bowels or bladder.  She does not bite her tongue or lose control of the bowels or bladder with her seizures.  The patient is on Lamictal and fairly low-dose taking 100 mg twice daily.  She currently has no medical  insurance, she is referred by her primary care physician for further evaluation.  REVIEW OF SYSTEMS: Out of a complete 14 system review of symptoms, the patient complains only of the following symptoms, and all other reviewed systems are negative.  ALLERGIES: No Known Allergies  HOME MEDICATIONS: Outpatient Medications Prior to Visit  Medication Sig Dispense Refill   buPROPion (WELLBUTRIN XL) 150 MG 24 hr tablet Take 1 tablet (150 mg total) by mouth daily. 30 tablet 0   busPIRone (BUSPAR) 5 MG tablet Take 5 mg by mouth daily.     clonazePAM (KLONOPIN) 0.5 MG tablet Take 0.5 mg by mouth 2 (two) times daily as needed for anxiety. Pt last had prescription fill 06/2017 in review of medical records      hydrOXYzine (ATARAX/VISTARIL) 50 MG tablet Take 1 tablet (50 mg total) by mouth 3 (three) times daily. 60 tablet 1   lamoTRIgine (LAMICTAL) 200 MG tablet Take 1 tablet (200 mg total) by mouth 2 (two) times daily. 60 tablet 3   No facility-administered medications prior to visit.     PAST MEDICAL HISTORY: Past Medical History:  Diagnosis Date   Common migraine with intractable migraine 07/13/2018   Depression    PTSD (post-traumatic stress disorder)    Seizures (HCC)     PAST SURGICAL HISTORY: No past surgical history on file.  FAMILY HISTORY: Family History  Problem Relation Age of Onset   Seizures Mother    Seizures Maternal Grandfather     SOCIAL HISTORY: Social History   Socioeconomic  History   Marital status: Single    Spouse name: Not on file   Number of children: Not on file   Years of education: Not on file   Highest education level: Not on file  Occupational History   Not on file  Social Needs   Financial resource strain: Not on file   Food insecurity    Worry: Not on file    Inability: Not on file   Transportation needs    Medical: Not on file    Non-medical: Not on file  Tobacco Use   Smoking status: Never Smoker   Smokeless tobacco:  Never Used  Substance and Sexual Activity   Alcohol use: Yes    Comment: not weekly, very occasional   Drug use: No   Sexual activity: Not on file  Lifestyle   Physical activity    Days per week: Not on file    Minutes per session: Not on file   Stress: Not on file  Relationships   Social connections    Talks on phone: Not on file    Gets together: Not on file    Attends religious service: Not on file    Active member of club or organization: Not on file    Attends meetings of clubs or organizations: Not on file    Relationship status: Not on file   Intimate partner violence    Fear of current or ex partner: Not on file    Emotionally abused: Not on file    Physically abused: Not on file    Forced sexual activity: Not on file  Other Topics Concern   Not on file  Social History Narrative   Not on file      PHYSICAL EXAM  There were no vitals filed for this visit. There is no height or weight on file to calculate BMI.  Generalized: Well developed, in no acute distress   Neurological examination  Mentation: Alert oriented to time, place, history taking. Follows all commands speech and language fluent Cranial nerve II-XII: Pupils were equal round reactive to light. Extraocular movements were full, visual field were full on confrontational test. Facial sensation and strength were normal. Uvula tongue midline. Head turning and shoulder shrug  were normal and symmetric. Motor: The motor testing reveals 5 over 5 strength of all 4 extremities. Good symmetric motor tone is noted throughout.  Sensory: Sensory testing is intact to soft touch on all 4 extremities. No evidence of extinction is noted.  Coordination: Cerebellar testing reveals good finger-nose-finger and heel-to-shin bilaterally.  Gait and station: Gait is normal. Tandem gait is normal. Romberg is negative. No drift is seen.  Reflexes: Deep tendon reflexes are symmetric and normal bilaterally.   DIAGNOSTIC  DATA (LABS, IMAGING, TESTING) - I reviewed patient records, labs, notes, testing and imaging myself where available.  Lab Results  Component Value Date   WBC 6.2 01/21/2018   HGB 13.1 01/21/2018   HCT 42.4 01/21/2018   MCV 84.3 01/21/2018   PLT 336 01/21/2018      Component Value Date/Time   NA 141 04/29/2018 1553   K 3.9 04/29/2018 1553   CL 104 04/29/2018 1553   CO2 22 04/29/2018 1553   GLUCOSE 82 04/29/2018 1553   GLUCOSE 89 01/21/2018 1511   BUN 10 04/29/2018 1553   CREATININE 1.00 04/29/2018 1553   CALCIUM 9.4 04/29/2018 1553   PROT 6.8 04/29/2018 1553   ALBUMIN 4.8 04/29/2018 1553   AST 14 04/29/2018 1553  ALT 9 04/29/2018 1553   ALKPHOS 53 04/29/2018 1553   BILITOT 0.2 04/29/2018 1553   GFRNONAA 80 04/29/2018 1553   GFRAA 92 04/29/2018 1553   No results found for: CHOL, HDL, LDLCALC, LDLDIRECT, TRIG, CHOLHDL No results found for: ZOXW9UHGBA1C No results found for: VITAMINB12 Lab Results  Component Value Date   TSH 1.710 04/29/2018      ASSESSMENT AND PLAN 23 y.o. year old female  has a past medical history of Common migraine with intractable migraine (07/13/2018), Depression, PTSD (post-traumatic stress disorder), and Seizures (HCC). here with ***   I spent 15 minutes with the patient. 50% of this time was spent   Margie EgeSarah Keygan Dumond, SeymourAGNP-C, DNP 11/01/2018, 11:28 AM [facility] [address], [address] [place], [place] [address] [phone]

## 2018-11-02 ENCOUNTER — Telehealth: Payer: Self-pay | Admitting: Neurology

## 2018-11-02 ENCOUNTER — Telehealth: Payer: Self-pay

## 2018-11-02 NOTE — Telephone Encounter (Signed)
Noted, thank you

## 2018-11-02 NOTE — Telephone Encounter (Signed)
Pt called about status of letter placed in epic being mailed to her. Pt's e-mail is [email or web address] request was made on 10/27/18.

## 2018-11-02 NOTE — Telephone Encounter (Signed)
Done e-mail letter today.

## 2018-11-25 ENCOUNTER — Other Ambulatory Visit: Payer: Self-pay | Admitting: Neurology

## 2018-11-25 DIAGNOSIS — Z5181 Encounter for therapeutic drug level monitoring: Secondary | ICD-10-CM

## 2018-12-08 NOTE — Progress Notes (Deleted)
PATIENT: Jacqueline Castaneda DOB: 08-Jan-1996  REASON FOR VISIT: follow up HISTORY FROM: patient  HISTORY OF PRESENT ILLNESS: Today 12/08/18  HISTORY 07/13/2018 Dr. Jannifer Franklin: Jacqueline Castaneda is a 23 year old right-handed black female with a history of seizures that began on 18 Aug 2016.  The patient was on Wellbutrin at that time, she does have a significant issue with depression and posttraumatic stress disorder, schizoaffective disorder.  The patient does have a strong family history of seizures in a maternal grandfather and the mother.  The patient apparently had a seizure work-up in [place], she claims that she had MRI at [facility], she also had an EEG study but was told that this was unremarkable.  She apparently has had 2 generalized tonic-clonic seizures, and now is having frequent focal seizures that are associated with zoning out and some jerking of the right hand.  The patient does not operate a motor vehicle.  The patient may have up to 20 seizures a day.  She has not undergone video EEG monitoring.  She reports no problems with significant numbness or weakness of extremities, she does have migraine headaches that occur about every 3 days, she takes Excedrin Migraine for these.  She reports no balance problems or difficulty controlling bowels or bladder.  She does not bite her tongue or lose control of the bowels or bladder with her seizures.  The patient is on Lamictal and fairly low-dose taking 100 mg twice daily.  She currently has no medical insurance, she is referred by her primary care physician for further evaluation.  REVIEW OF SYSTEMS: Out of a complete 14 system review of symptoms, the patient complains only of the following symptoms, and all other reviewed systems are negative.  ALLERGIES: No Known Allergies  HOME MEDICATIONS: Outpatient Medications Prior to Visit  Medication Sig Dispense Refill  . buPROPion (WELLBUTRIN XL) 150 MG 24 hr tablet Take 1 tablet (150 mg total) by  mouth daily. 30 tablet 0  . busPIRone (BUSPAR) 5 MG tablet Take 5 mg by mouth daily.    . clonazePAM (KLONOPIN) 0.5 MG tablet Take 0.5 mg by mouth 2 (two) times daily as needed for anxiety. Pt last had prescription fill 06/2017 in review of medical records     . hydrOXYzine (ATARAX/VISTARIL) 50 MG tablet Take 1 tablet (50 mg total) by mouth 3 (three) times daily. 60 tablet 1  . lamoTRIgine (LAMICTAL) 200 MG tablet Take 1 tablet (200 mg total) by mouth 2 (two) times daily. 60 tablet 3   No facility-administered medications prior to visit.     PAST MEDICAL HISTORY: Past Medical History:  Diagnosis Date  . Common migraine with intractable migraine 07/13/2018  . Depression   . PTSD (post-traumatic stress disorder)   . Seizures ([place])     PAST SURGICAL HISTORY: No past surgical history on file.  FAMILY HISTORY: Family History  Problem Relation Age of Onset  . Seizures Mother   . Seizures Maternal Grandfather     SOCIAL HISTORY: Social History   Socioeconomic History  . Marital status: Single    Spouse name: Not on file  . Number of children: Not on file  . Years of education: Not on file  . Highest education level: Not on file  Occupational History  . Not on file  Social Needs  . Financial resource strain: Not on file  . Food insecurity    Worry: Not on file    Inability: Not on file  . Transportation needs  Medical: Not on file    Non-medical: Not on file  Tobacco Use  . Smoking status: Never Smoker  . Smokeless tobacco: Never Used  Substance and Sexual Activity  . Alcohol use: Yes    Comment: not weekly, very occasional  . Drug use: No  . Sexual activity: Not on file  Lifestyle  . Physical activity    Days per week: Not on file    Minutes per session: Not on file  . Stress: Not on file  Relationships  . Social Musician on phone: Not on file    Gets together: Not on file    Attends religious service: Not on file    Active member of club or  organization: Not on file    Attends meetings of clubs or organizations: Not on file    Relationship status: Not on file  . Intimate partner violence    Fear of current or ex partner: Not on file    Emotionally abused: Not on file    Physically abused: Not on file    Forced sexual activity: Not on file  Other Topics Concern  . Not on file  Social History Narrative  . Not on file      PHYSICAL EXAM  There were no vitals filed for this visit. There is no height or weight on file to calculate BMI.  Generalized: Well developed, in no acute distress   Neurological examination  Mentation: Alert oriented to time, place, history taking. Follows all commands speech and language fluent Cranial nerve II-XII: Pupils were equal round reactive to light. Extraocular movements were full, visual field were full on confrontational test. Facial sensation and strength were normal. Uvula tongue midline. Head turning and shoulder shrug  were normal and symmetric. Motor: The motor testing reveals 5 over 5 strength of all 4 extremities. Good symmetric motor tone is noted throughout.  Sensory: Sensory testing is intact to soft touch on all 4 extremities. No evidence of extinction is noted.  Coordination: Cerebellar testing reveals good finger-nose-finger and heel-to-shin bilaterally.  Gait and station: Gait is normal. Tandem gait is normal. Romberg is negative. No drift is seen.  Reflexes: Deep tendon reflexes are symmetric and normal bilaterally.   DIAGNOSTIC DATA (LABS, IMAGING, TESTING) - I reviewed patient records, labs, notes, testing and imaging myself where available.  Lab Results  Component Value Date   WBC 6.2 01/21/2018   HGB 13.1 01/21/2018   HCT 42.4 01/21/2018   MCV 84.3 01/21/2018   PLT 336 01/21/2018      Component Value Date/Time   NA 141 04/29/2018 1553   K 3.9 04/29/2018 1553   CL 104 04/29/2018 1553   CO2 22 04/29/2018 1553   GLUCOSE 82 04/29/2018 1553   GLUCOSE 89  01/21/2018 1511   BUN 10 04/29/2018 1553   CREATININE 1.00 04/29/2018 1553   CALCIUM 9.4 04/29/2018 1553   PROT 6.8 04/29/2018 1553   ALBUMIN 4.8 04/29/2018 1553   AST 14 04/29/2018 1553   ALT 9 04/29/2018 1553   ALKPHOS 53 04/29/2018 1553   BILITOT 0.2 04/29/2018 1553   GFRNONAA 80 04/29/2018 1553   GFRAA 92 04/29/2018 1553   No results found for: CHOL, HDL, LDLCALC, LDLDIRECT, TRIG, CHOLHDL No results found for: QXIH0T No results found for: VITAMINB12 Lab Results  Component Value Date   TSH 1.710 04/29/2018      ASSESSMENT AND PLAN 23 y.o. year old female  has a past medical history of Common  migraine with intractable migraine (07/13/2018), Depression, PTSD (post-traumatic stress disorder), and Seizures (HCC). here with ***   I spent 15 minutes with the patient. 50% of this time was spent   Margie EgeSarah Aiyden Lauderback, ChevakAGNP-C, DNP 12/08/2018, 7:48 PM [facility] [address], [address] [place], [place] [address] [phone]

## 2018-12-09 ENCOUNTER — Other Ambulatory Visit: Payer: Self-pay

## 2018-12-09 ENCOUNTER — Ambulatory Visit: Payer: Self-pay | Admitting: Neurology

## 2018-12-31 ENCOUNTER — Other Ambulatory Visit: Payer: Self-pay

## 2018-12-31 MED ORDER — LAMOTRIGINE 200 MG PO TABS: 200.0000 mg | ORAL_TABLET | Freq: Two times a day (BID) | ORAL | 3 refills | Status: DC

## 2018-12-31 MED FILL — lamoTRIgine 200 MG TABS: 200 | 30 days supply | Qty: 60 | Fill #0

## 2019-01-10 ENCOUNTER — Emergency Department (HOSPITAL_COMMUNITY)
Admission: EM | Admit: 2019-01-10 | Discharge: 2019-01-11 | Disposition: A | Discharge status: Home / Self Care | Payer: Self-pay | Attending: Emergency Medicine | Admitting: Emergency Medicine

## 2019-01-10 ENCOUNTER — Other Ambulatory Visit: Payer: Self-pay

## 2019-01-10 ENCOUNTER — Encounter (HOSPITAL_COMMUNITY): Payer: Self-pay | Admitting: Emergency Medicine

## 2019-01-10 DIAGNOSIS — R197 Diarrhea, unspecified: Secondary | ICD-10-CM | POA: Insufficient documentation

## 2019-01-10 DIAGNOSIS — R42 Dizziness and giddiness: Secondary | ICD-10-CM | POA: Insufficient documentation

## 2019-01-10 DIAGNOSIS — R112 Nausea with vomiting, unspecified: Secondary | ICD-10-CM | POA: Insufficient documentation

## 2019-01-10 MED ORDER — SODIUM CHLORIDE 0.9% FLUSH: 3.0000 mL | Freq: Once | INTRAVENOUS | Status: DC

## 2019-01-10 NOTE — ED Triage Notes (Signed)
PT presents by [facility] for evaluation of N/V/D for the last few days. Pt states she feels disoriented

## 2019-01-10 NOTE — ED Notes (Signed)
Urine culture sent to the lab. 

## 2019-01-11 LAB — URINALYSIS, ROUTINE W REFLEX MICROSCOPIC
Bacteria, UA: NONE SEEN
Bilirubin Urine: NEGATIVE
Glucose, UA: NEGATIVE mg/dL
Ketones, ur: 5 mg/dL — AB
Nitrite: NEGATIVE
Protein, ur: 100 mg/dL — AB
RBC / HPF: >50 RBC/hpf — ABNORMAL HIGH (ref 0–5)
Specific Gravity, Urine: 1.026 (ref 1.005–1.030)
pH: 5 (ref 5.0–8.0)

## 2019-01-11 LAB — CBC WITH DIFFERENTIAL/PLATELET
Abs Immature Granulocytes: 0.03 10*3/uL (ref 0.00–0.07)
Basophils Absolute: 0 10*3/uL (ref 0.0–0.1)
Basophils Relative: 0 %
Eosinophils Absolute: 0 10*3/uL (ref 0.0–0.5)
Eosinophils Relative: 0 %
HCT: 43.7 % (ref 36.0–46.0)
Hemoglobin: 13.7 g/dL (ref 12.0–15.0)
Immature Granulocytes: 0 %
Lymphocytes Relative: 7 %
Lymphs Abs: 0.7 10*3/uL (ref 0.7–4.0)
MCH: 27 pg (ref 26.0–34.0)
MCHC: 31.4 g/dL (ref 30.0–36.0)
MCV: 86.2 fL (ref 80.0–100.0)
Monocytes Absolute: 0.6 10*3/uL (ref 0.1–1.0)
Monocytes Relative: 6 %
Neutro Abs: 8.5 10*3/uL — ABNORMAL HIGH (ref 1.7–7.7)
Neutrophils Relative %: 87 %
Platelets: 186 10*3/uL (ref 150–400)
RBC: 5.07 MIL/uL (ref 3.87–5.11)
RDW: 13.9 % (ref 11.5–15.5)
WBC: 9.8 10*3/uL (ref 4.0–10.5)
nRBC: 0 % (ref 0.0–0.2)

## 2019-01-11 LAB — LIPASE, BLOOD: Lipase: 33 U/L (ref 11–51)

## 2019-01-11 LAB — COMPREHENSIVE METABOLIC PANEL
ALT: 13 U/L (ref 0–44)
AST: 19 U/L (ref 15–41)
Albumin: 4 g/dL (ref 3.5–5.0)
Alkaline Phosphatase: 52 U/L (ref 38–126)
Anion gap: 12 (ref 5–15)
BUN: 5 mg/dL — ABNORMAL LOW (ref 6–20)
CO2: 25 mmol/L (ref 22–32)
Calcium: 9.2 mg/dL (ref 8.9–10.3)
Chloride: 101 mmol/L (ref 98–111)
Creatinine, Ser: 1 mg/dL (ref 0.44–1.00)
GFR calc Af Amer: >60 mL/min (ref >60)
GFR calc non Af Amer: >60 mL/min (ref >60)
Glucose, Bld: 111 mg/dL — ABNORMAL HIGH (ref 70–99)
Potassium: 3.5 mmol/L (ref 3.5–5.1)
Sodium: 138 mmol/L (ref 135–145)
Total Bilirubin: 0.5 mg/dL (ref 0.3–1.2)
Total Protein: 6.8 g/dL (ref 6.5–8.1)

## 2019-01-11 LAB — I-STAT BETA HCG BLOOD, ED (MC, WL, AP ONLY): I-stat hCG, quantitative: <5 m[IU]/mL (ref <5)

## 2019-01-11 MED ORDER — LACTATED RINGERS IV BOLUS
1000.0000 mL | Freq: Once | INTRAVENOUS | Status: AC
  Administered 2019-01-11: 01:00:00 1000 mL via INTRAVENOUS

## 2019-01-11 MED ORDER — MECLIZINE HCL 25 MG PO TABS: 25.0000 mg | ORAL_TABLET | Freq: Three times a day (TID) | ORAL | 0 refills | Status: AC | PRN

## 2019-01-11 MED ORDER — MECLIZINE HCL 25 MG PO TABS
25.0000 mg | ORAL_TABLET | Freq: Once | ORAL | Status: AC
  Administered 2019-01-11: 01:00:00 25 mg via ORAL
  Filled 2019-01-11: qty 1

## 2019-01-11 MED ORDER — ONDANSETRON HCL 4 MG/2ML IJ SOLN
4.0000 mg | Freq: Once | INTRAMUSCULAR | Status: AC
  Administered 2019-01-11: 01:00:00 4 mg via INTRAVENOUS
  Filled 2019-01-11: qty 2

## 2019-01-11 NOTE — Discharge Instructions (Signed)
It's possible the medicine may be causing your side effects. The level is pending. I would decrease to half a pill twice a day for now until you follow up with your neurologist. I will call you if the level is too high.

## 2019-01-11 NOTE — ED Provider Notes (Signed)
Emergency Department Provider Note   I have reviewed the triage vital signs and the nursing notes.   HISTORY  Chief Complaint Nausea and Emesis   HPI Jacqueline Castaneda is a 23 y.o. female who presents emerge department tonight with a couple days of multiple symptoms.  She has dizziness, intermittent blurred vision, double vision, nausea, vomiting, diarrhea, lightheadedness.  She has headaches.  She states he has a history of seizures and PTSD.  She has been recently titrating up her doses of Lamictal.  She has no abdominal pain.  She has no fevers.  She has no other associated symptoms.  No trauma.  No neck pain.  No other associated or modifying symptoms.    Past Medical History:  Diagnosis Date  . Common migraine with intractable migraine 07/13/2018  . Depression   . PTSD (post-traumatic stress disorder)   . Seizures [facility])     Patient Active Problem List   Diagnosis Date Noted  . Seizure disorder ([place]) 07/13/2018  . Common migraine with intractable migraine 07/13/2018    History reviewed. No pertinent surgical history.  Current Outpatient Rx  . Order #: [id number] Class: Historical Med  . Order #: [id number] Class: Normal  . Order #: [id number] Class: Historical Med  . Order #: [id number] Class: Normal    Allergies Patient has no known allergies.  Family History  Problem Relation Age of Onset  . Seizures Mother   . Seizures Maternal Grandfather     Social History Social History   Tobacco Use  . Smoking status: Never Smoker  . Smokeless tobacco: Never Used  Substance Use Topics  . Alcohol use: Yes    Comment: not weekly, very occasional  . Drug use: No    Review of Systems  All other systems negative except as documented in the HPI. All pertinent positives and negatives as reviewed in the HPI. ____________________________________________   PHYSICAL EXAM:  VITAL SIGNS: ED Triage Vitals  Enc Vitals Group     BP 01/10/19 2316 120/87     Pulse Rate 01/10/19  2316 (!) 106     Resp 01/10/19 2316 18     Temp 01/10/19 2316 97.8 F (36.6 C)     Temp Source 01/10/19 2316 Oral     SpO2 01/10/19 2316 100 %     Weight 01/10/19 2316 149 lb (67.6 kg)     Height 01/10/19 2316 5\' 1"  (1.549 m)     Head Circumference --      Peak Flow --      Pain Score 01/10/19 2321 8     Pain Loc --      Pain Edu? --      Excl. in [place]? --     Constitutional: Alert and oriented. Well appearing and in no acute distress. Eyes: Conjunctivae are normal. PERRL. EOMI. Head: Atraumatic. Nose: No congestion/rhinnorhea. Mouth/Throat: Mucous membranes are moist.  Oropharynx non-erythematous. Neck: No stridor.  No meningeal signs.   Cardiovascular: Normal rate, regular rhythm. Good peripheral circulation. Grossly normal heart sounds.   Respiratory: Normal respiratory effort.  No retractions. Lungs CTAB. Gastrointestinal: Soft and nontender. No distention.  Musculoskeletal: No lower extremity tenderness nor edema. No gross deformities of extremities. Neurologic:  Normal speech and language. No gross focal neurologic deficits are appreciated. No altered mental status, able to give full seemingly accurate history.  Face is symmetric, EOM's intact, pupils equal and reactive, vision intact, tongue and uvula midline without deviation. Upper and Lower extremity motor 5/5, intact pain perception in distal extremities,  2+ reflexes in biceps, patella and achilles tendons. Able to perform finger to nose normal with both hands. Walks without assistance or evident ataxia.  Skin:  Skin is warm, dry and intact. No rash noted.   ____________________________________________   LABS (all labs ordered are listed, but only abnormal results are displayed)  Labs Reviewed  URINALYSIS, ROUTINE W REFLEX MICROSCOPIC - Abnormal; Notable for the following components:      Result Value   Color, Urine AMBER (*)    APPearance HAZY (*)    Hgb urine dipstick LARGE (*)    Ketones, ur 5 (*)    Protein,  ur 100 (*)    Leukocytes,Ua SMALL (*)    RBC / HPF >50 (*)    All other components within normal limits  CBC WITH DIFFERENTIAL/PLATELET - Abnormal; Notable for the following components:   Neutro Abs 8.5 (*)    All other components within normal limits  COMPREHENSIVE METABOLIC PANEL - Abnormal; Notable for the following components:   Glucose, Bld 111 (*)    BUN 5 (*)    All other components within normal limits  LIPASE, BLOOD  LAMOTRIGINE LEVEL  I-STAT BETA HCG BLOOD, ED ([facility], [facility], [facility] ONLY)   ____________________________________________  EKG   EKG Interpretation  Date/Time:    Ventricular Rate:    PR Interval:    QRS Duration:   QT Interval:    QTC Calculation:   R Axis:     Text Interpretation:         ____________________________________________  RADIOLOGY  No results found.  ____________________________________________   PROCEDURES  Procedure(s) performed:   Procedures   ____________________________________________   INITIAL IMPRESSION / ASSESSMENT AND PLAN / ED COURSE  Unclear etiology of her symptoms at this time.  Her work-up here is negative.  She is asymptomatic now.  Concern for possible side effects from lamotrigine.  Asked her to reduce it back down to about half and follow-up with her neurologist.  Lamictal level is pending.  Message sent to her neurologist in the computer.     Pertinent labs & imaging results that were available during my care of the patient were reviewed by me and considered in my medical decision making (see chart for details).   A medical screening exam was performed and I feel the patient has had an appropriate workup for their chief complaint at this time and likelihood of emergent condition existing is low. They have been counseled on decision, discharge, follow up and which symptoms necessitate immediate return to the emergency department. They or their family verbally stated understanding and agreement with plan and  discharged in stable condition.   ____________________________________________  FINAL CLINICAL IMPRESSION(S) / ED DIAGNOSES  Final diagnoses:  Nausea and vomiting, intractability of vomiting not specified, unspecified vomiting type  Diarrhea, unspecified type  Vertigo     MEDICATIONS GIVEN DURING THIS VISIT:  Medications  lactated ringers bolus 1,000 mL (0 mLs Intravenous Stopped 01/11/19 0304)  meclizine (ANTIVERT) tablet 25 mg (25 mg Oral Given 01/11/19 0035)  ondansetron (ZOFRAN) injection 4 mg (4 mg Intravenous Given 01/11/19 0034)     NEW OUTPATIENT MEDICATIONS STARTED DURING THIS VISIT:  Discharge Medication List as of 01/11/2019  2:51 AM    START taking these medications   Details  meclizine (ANTIVERT) 25 MG tablet Take 1 tablet (25 mg total) by mouth 3 (three) times daily as needed for dizziness., Starting Mon 01/11/2019, Normal        Note:  This note was  prepared with assistance of Conservation officer, historic buildingsDragon voice recognition software. Occasional wrong-word or sound-a-like substitutions may have occurred due to the inherent limitations of voice recognition software.   Allyna Pittsley, Barbara CowerJason, MD 01/12/19 [phone]

## 2019-01-11 NOTE — ED Notes (Signed)
Recollected light green tube and sent to lab 

## 2019-01-12 ENCOUNTER — Encounter: Payer: Self-pay | Admitting: Neurology

## 2019-01-12 ENCOUNTER — Other Ambulatory Visit: Payer: Self-pay

## 2019-01-12 ENCOUNTER — Ambulatory Visit: Payer: Self-pay | Admitting: Neurology

## 2019-01-12 VITALS — BP 124/89 | HR 103 | Temp 98.4°F | Ht 61.0 in | Wt 147.0 lb

## 2019-01-12 DIAGNOSIS — G43019 Migraine without aura, intractable, without status migrainosus: Secondary | ICD-10-CM

## 2019-01-12 DIAGNOSIS — G40909 Epilepsy, unspecified, not intractable, without status epilepticus: Secondary | ICD-10-CM

## 2019-01-12 LAB — LAMOTRIGINE LEVEL: Lamotrigine Lvl: 31.7 ug/mL — ABNORMAL HIGH (ref 2.0–20.0)

## 2019-01-12 MED ORDER — CARBAMAZEPINE 200 MG PO TABS: ORAL_TABLET | ORAL | 3 refills | Status: AC

## 2019-01-12 NOTE — Progress Notes (Signed)
Reason for visit: Seizures  Jacqueline Castaneda is a 23 y.o. female  History of present illness:  Jacqueline Castaneda is a 23 year old right-handed black female with a history of seizures, she has recently moved from [place].  She had a CT scan of the brain in [place] but never had MRI of the brain.  She has been on Lamictal for quite some time, she was only on 100 mg twice daily, she was increased to 150 mg twice daily in April 2020.  She continued to have a seizure events, she was increased on 05 October 2018 to 200 mg twice daily.  She has done well on this dose in terms of tolerance, but she continues to have about 1 seizure a week.  She was seen in the emergency room on 10 January 2019 with what sounded like hallucinations and blurred vision, nausea and vomiting, diarrhea, and lightheaded sensations.  The patient does have a history of migraine headaches as well.  The patient was felt to have side effects from the lamotrigine and the dose was cut in half and she was sent to this office.  The patient had been on Wellbutrin but she stopped this several months ago.  She currently is not working, she feels still spacey following the emergency room visit, she has not continued to have hallucinations.  She is not operating a motor vehicle.  Past Medical History:  Diagnosis Date  . Common migraine with intractable migraine 07/13/2018  . Depression   . PTSD (post-traumatic stress disorder)   . Seizures (HCC)     No past surgical history on file.  Family History  Problem Relation Age of Onset  . Seizures Mother   . Seizures Maternal Grandfather     Social history:  reports that she has never smoked. She has never used smokeless tobacco. She reports current alcohol use. She reports that she does not use drugs.  Medications:  Prior to Admission medications   Medication Sig Start Date End Date Taking? Authorizing Provider  lamoTRIgine (LAMICTAL) 200 MG tablet Take 1 tablet (200 mg total) by mouth 2 (two) times  daily. 12/31/18  Yes York Spaniel, MD  meclizine (ANTIVERT) 25 MG tablet Take 1 tablet (25 mg total) by mouth 3 (three) times daily as needed for dizziness. 01/11/19  Yes Mesner, Barbara Cower, MD  Multiple Vitamins-Minerals (MULTIVITAMIN ADULT PO) Take 1 tablet by mouth daily.   Yes [provider]  buPROPion (WELLBUTRIN XL) 150 MG 24 hr tablet Take 1 tablet (150 mg total) by mouth daily. Patient not taking: Reported on 01/11/2019 08/03/18 01/11/19  Bing Neighbors, FNP  venlafaxine XR (EFFEXOR-XR) 75 MG 24 hr capsule Take 75 mg by mouth daily with breakfast.  04/29/18  [provider]     No Known Allergies  ROS:  Out of a complete 14 system review of symptoms, the patient complains only of the following symptoms, and all other reviewed systems are negative.  Seizures Hallucinations Anxiety and depression  Blood pressure 124/89, pulse (!) 103, temperature 98.4 F (36.9 C), temperature source Temporal, height 5\' 1"  (1.549 m), weight 147 lb (66.7 kg), last menstrual period 01/07/2019.  Physical Exam  General: The patient is alert and cooperative at the time of the examination.  Eyes: Pupils are equal, round, and reactive to light. Discs are flat bilaterally.  Neck: The neck is supple, no carotid bruits are noted.  Respiratory: The respiratory examination is clear.  Cardiovascular: The cardiovascular examination reveals a regular rate and  rhythm, no obvious murmurs or rubs are noted.  Skin: Extremities are without significant edema.  Neurologic Exam  Mental status: The patient is alert and oriented x 3 at the time of the examination. The patient has apparent normal recent and remote memory, with an apparently normal attention span and concentration ability.  Cranial nerves: Facial symmetry is present. There is good sensation of the face to pinprick and soft touch bilaterally.  The patient however splits the midline with vibration sensation of the forehead,  decreased on the left.  The strength of the facial muscles and the muscles to head turning and shoulder shrug are normal bilaterally. Speech is well enunciated, no aphasia or dysarthria is noted. Extraocular movements are full. Visual fields are full. The tongue is midline, and the patient has symmetric elevation of the soft palate. No obvious hearing deficits are noted.  Motor: The motor testing reveals 5 over 5 strength of all 4 extremities. Good symmetric motor tone is noted throughout.  Sensory: Sensory testing is notable for decreased pinprick sensation on the left arm and left leg compared to the right.  Vibration sensation is decreased on the left arm and leg as compared to the right.  Coordination: Cerebellar testing reveals good finger-nose-finger and heel-to-shin bilaterally.  Gait and station: Gait is normal. Tandem gait is normal. Romberg is negative. No drift is seen.  Reflexes: Deep tendon reflexes are symmetric and normal bilaterally. Toes are downgoing bilaterally.   Assessment/Plan:  1.  History of intractable seizures  2.  Anxiety and depression  3.  Migraine headache  4.  Nonorganic sensory examination  The patient recent went to the emergency room with what sounds like hallucinations and spaciness, nausea and vomiting and dizziness.  The patient had been on a stable dose of Lamictal taking 200 mg twice daily for 3 months or more prior, she has been on Lamictal for quite some time at lower doses.  I do not believe that the Lamictal was a source of her symptoms, I would be concerned about psychotic depression as the patient has recently come off of her antidepressant medications.  The patient will need MRI evaluation of the brain at some point, she currently has no insurance.  We will reorder the EEG study, this was never done.  She will be placed on carbamazepine taking 100 mg twice daily for 2 weeks and go to 200 mg twice daily.  She will remain on Lamictal 200 mg twice  daily.  She will follow-up in 4 months.  Jill Alexanders MD 01/12/2019 11:34 AM  [facility] [address] [place], [place] [address]  Phone [phone] Fax [phone]

## 2019-01-12 NOTE — Patient Instructions (Signed)
We will continue Lamotrigine 200 mg twice a day, and go on carbamazepine working up to 200 mg twice a day.   Lamictal (lamotrigine) is a seizure medication that occasionally may be used for other purposes such as peripheral neuropathy pain or certain types of headache. This medication is relatively safe, but occasionally side effects can occur. A skin rash may occur when first starting the medication. As with any seizure medication, depression may worsen on the drug. Other potential side effects include dizziness, headache, drowsiness or insomnia, decreased concentration, or stomach upset. This medication may also be used as a mood stabilizer. If you believe that you are having side effects on the medication, please contact our office.  Tegretol (carbamazepine) may result in dizziness, gait instability, cognitive slowing, or drowsiness. Sometimes, and allergic rash may occur, or a photosensitive rash may occur. If any significant side effects are noted, please contact our office.

## 2019-01-13 ENCOUNTER — Telehealth: Payer: Self-pay | Admitting: Neurology

## 2019-01-13 DIAGNOSIS — G40909 Epilepsy, unspecified, not intractable, without status epilepticus: Secondary | ICD-10-CM

## 2019-01-13 MED ORDER — LAMOTRIGINE 150 MG PO TABS: 150.0000 mg | ORAL_TABLET | Freq: Two times a day (BID) | ORAL | 3 refills | Status: AC

## 2019-01-13 NOTE — Telephone Encounter (Signed)
Please call the patient and have her come back in 1 month for a Lamictal level recheck. Just a lab draw, she does not need an appointment. I will place the order.

## 2019-01-13 NOTE — Telephone Encounter (Signed)
I called the patient. The ER sent me her most recent Lamictal level which came back elevated at 31.7.  We will decrease her dose of Lamictal to 150 mg twice a day.  I have sent a new prescription. I did discuss this with Dr. Jannifer Franklin. She has follow-up appointment in 4 months.  She indicates she was taking the medication as prescribed.

## 2019-03-02 ENCOUNTER — Encounter: Payer: Self-pay | Admitting: Neurology

## 2019-03-03 ENCOUNTER — Encounter: Payer: Self-pay | Admitting: Neurology

## 2019-03-03 ENCOUNTER — Telehealth: Payer: Self-pay | Admitting: Neurology

## 2019-03-03 DIAGNOSIS — Z5181 Encounter for therapeutic drug level monitoring: Secondary | ICD-10-CM

## 2019-03-03 DIAGNOSIS — G40909 Epilepsy, unspecified, not intractable, without status epilepticus: Secondary | ICD-10-CM

## 2019-03-03 NOTE — Telephone Encounter (Signed)
I called the patient.  The patient is still having some troubles with focusing.  It is possible that the addition of carbamazepine may have also worsened this issue.  The dose of the Lamictal was reduced.  I will have the patient come in for blood work, the EEG study still has not been done, I will reorder this.  I will write a letter indicating that the patient is having cognitive side effects on her medication.

## 2019-03-22 DIAGNOSIS — Z0271 Encounter for disability determination: Secondary | ICD-10-CM

## 2019-03-30 NOTE — Telephone Encounter (Signed)
Accomodation emailed to pt [email or web address]. To MR for scanning.

## 2019-04-01 ENCOUNTER — Telehealth: Payer: Self-pay | Admitting: Neurology

## 2019-04-01 NOTE — Telephone Encounter (Signed)
Faxed forms to [facility]

## 2019-04-13 ENCOUNTER — Ambulatory Visit (INDEPENDENT_AMBULATORY_CARE_PROVIDER_SITE_OTHER): Payer: Self-pay | Admitting: Nurse Practitioner

## 2019-05-20 ENCOUNTER — Ambulatory Visit: Payer: Self-pay | Admitting: Neurology

## 2019-07-08 ENCOUNTER — Ambulatory Visit: Payer: Self-pay | Admitting: Neurology

## 2019-07-28 ENCOUNTER — Ambulatory Visit (INDEPENDENT_AMBULATORY_CARE_PROVIDER_SITE_OTHER): Payer: Self-pay | Admitting: Nurse Practitioner

## 2020-01-27 ENCOUNTER — Encounter (HOSPITAL_COMMUNITY): Payer: Self-pay

## 2020-01-27 ENCOUNTER — Ambulatory Visit (INDEPENDENT_AMBULATORY_CARE_PROVIDER_SITE_OTHER): Payer: No Typology Code available for payment source

## 2020-01-27 ENCOUNTER — Encounter (HOSPITAL_BASED_OUTPATIENT_CLINIC_OR_DEPARTMENT_OTHER): Payer: Self-pay

## 2020-01-27 ENCOUNTER — Ambulatory Visit (INDEPENDENT_AMBULATORY_CARE_PROVIDER_SITE_OTHER)
Admission: EM | Admit: 2020-01-27 | Discharge: 2020-01-27 | Disposition: A | Discharge status: Home / Self Care | Payer: No Typology Code available for payment source | Source: Home / Self Care

## 2020-01-27 ENCOUNTER — Emergency Department (HOSPITAL_BASED_OUTPATIENT_CLINIC_OR_DEPARTMENT_OTHER)
Admission: EM | Admit: 2020-01-27 | Discharge: 2020-01-27 | Disposition: A | Discharge status: Home / Self Care | Payer: No Typology Code available for payment source | Attending: Emergency Medicine | Admitting: Emergency Medicine

## 2020-01-27 ENCOUNTER — Emergency Department (HOSPITAL_BASED_OUTPATIENT_CLINIC_OR_DEPARTMENT_OTHER): Payer: No Typology Code available for payment source

## 2020-01-27 ENCOUNTER — Telehealth (INDEPENDENT_AMBULATORY_CARE_PROVIDER_SITE_OTHER): Payer: Self-pay

## 2020-01-27 ENCOUNTER — Other Ambulatory Visit: Payer: Self-pay

## 2020-01-27 DIAGNOSIS — R110 Nausea: Secondary | ICD-10-CM | POA: Diagnosis not present

## 2020-01-27 DIAGNOSIS — R1031 Right lower quadrant pain: Secondary | ICD-10-CM

## 2020-01-27 DIAGNOSIS — R42 Dizziness and giddiness: Secondary | ICD-10-CM | POA: Diagnosis not present

## 2020-01-27 DIAGNOSIS — Z79899 Other long term (current) drug therapy: Secondary | ICD-10-CM | POA: Diagnosis not present

## 2020-01-27 DIAGNOSIS — Z3202 Encounter for pregnancy test, result negative: Secondary | ICD-10-CM | POA: Diagnosis not present

## 2020-01-27 DIAGNOSIS — N838 Other noninflammatory disorders of ovary, fallopian tube and broad ligament: Secondary | ICD-10-CM | POA: Insufficient documentation

## 2020-01-27 DIAGNOSIS — R109 Unspecified abdominal pain: Secondary | ICD-10-CM | POA: Diagnosis present

## 2020-01-27 LAB — COMPREHENSIVE METABOLIC PANEL
ALT: 14 U/L (ref 0–44)
AST: 15 U/L (ref 15–41)
Albumin: 4 g/dL (ref 3.5–5.0)
Alkaline Phosphatase: 57 U/L (ref 38–126)
Anion gap: 10 (ref 5–15)
BUN: 17 mg/dL (ref 6–20)
CO2: 24 mmol/L (ref 22–32)
Calcium: 9.4 mg/dL (ref 8.9–10.3)
Chloride: 101 mmol/L (ref 98–111)
Creatinine, Ser: 0.86 mg/dL (ref 0.44–1.00)
GFR, Estimated: >60 mL/min (ref >60)
Glucose, Bld: 90 mg/dL (ref 70–99)
Potassium: 4.1 mmol/L (ref 3.5–5.1)
Sodium: 135 mmol/L (ref 135–145)
Total Bilirubin: 0.2 mg/dL — ABNORMAL LOW (ref 0.3–1.2)
Total Protein: 7.7 g/dL (ref 6.5–8.1)

## 2020-01-27 LAB — CBC WITH DIFFERENTIAL/PLATELET
Abs Immature Granulocytes: 0.03 10*3/uL (ref 0.00–0.07)
Basophils Absolute: 0 10*3/uL (ref 0.0–0.1)
Basophils Relative: 0 %
Eosinophils Absolute: 0.1 10*3/uL (ref 0.0–0.5)
Eosinophils Relative: 1 %
HCT: 40.8 % (ref 36.0–46.0)
Hemoglobin: 12.6 g/dL (ref 12.0–15.0)
Immature Granulocytes: 0 %
Lymphocytes Relative: 16 %
Lymphs Abs: 1.3 10*3/uL (ref 0.7–4.0)
MCH: 25.9 pg — ABNORMAL LOW (ref 26.0–34.0)
MCHC: 30.9 g/dL (ref 30.0–36.0)
MCV: 84 fL (ref 80.0–100.0)
Monocytes Absolute: 0.6 10*3/uL (ref 0.1–1.0)
Monocytes Relative: 8 %
Neutro Abs: 6.2 10*3/uL (ref 1.7–7.7)
Neutrophils Relative %: 75 %
Platelets: 279 10*3/uL (ref 150–400)
RBC: 4.86 MIL/uL (ref 3.87–5.11)
RDW: 12.9 % (ref 11.5–15.5)
WBC: 8.2 10*3/uL (ref 4.0–10.5)
nRBC: 0 % (ref 0.0–0.2)

## 2020-01-27 LAB — URINALYSIS, ROUTINE W REFLEX MICROSCOPIC
Bilirubin Urine: NEGATIVE
Glucose, UA: NEGATIVE mg/dL
Hgb urine dipstick: NEGATIVE
Ketones, ur: NEGATIVE mg/dL
Leukocytes,Ua: NEGATIVE
Nitrite: NEGATIVE
Protein, ur: NEGATIVE mg/dL
Specific Gravity, Urine: 1.025 (ref 1.005–1.030)
pH: 6.5 (ref 5.0–8.0)

## 2020-01-27 LAB — POCT URINALYSIS DIPSTICK, ED / UC
Bilirubin Urine: NEGATIVE
Glucose, UA: NEGATIVE mg/dL
Hgb urine dipstick: NEGATIVE
Nitrite: NEGATIVE
Protein, ur: NEGATIVE mg/dL
Specific Gravity, Urine: >=1.03 (ref 1.005–1.030)
Urobilinogen, UA: 0.2 mg/dL (ref 0.0–1.0)
pH: 5.5 (ref 5.0–8.0)

## 2020-01-27 LAB — LIPASE, BLOOD: Lipase: 32 U/L (ref 11–51)

## 2020-01-27 LAB — POC URINE PREG, ED: Preg Test, Ur: NEGATIVE

## 2020-01-27 MED ORDER — ONDANSETRON HCL 4 MG/2ML IJ SOLN
4.0000 mg | Freq: Once | INTRAMUSCULAR | Status: AC
  Administered 2020-01-27: 4 mg via INTRAVENOUS
  Filled 2020-01-27: qty 2

## 2020-01-27 MED ORDER — LACTATED RINGERS IV BOLUS
1000.0000 mL | Freq: Once | INTRAVENOUS | Status: AC
  Administered 2020-01-27: 1000 mL via INTRAVENOUS

## 2020-01-27 MED ORDER — IOHEXOL 300 MG/ML  SOLN
100.0000 mL | Freq: Once | INTRAMUSCULAR | Status: AC | PRN
  Administered 2020-01-27: 100 mL via INTRAVENOUS

## 2020-01-27 MED ORDER — ACETAMINOPHEN 500 MG PO TABS
1000.0000 mg | ORAL_TABLET | Freq: Once | ORAL | Status: AC
  Administered 2020-01-27: 1000 mg via ORAL
  Filled 2020-01-27: qty 2

## 2020-01-27 NOTE — Discharge Instructions (Addendum)
Today your CT scan showed an abnormality in your pelvis.  It appears that you have cysts on both sides near your ovaries in addition to abnormal appearance near your uterus.  I would recommend that you follow-up with OB/GYN.  Most likely this is nothing concerning, however on the small chance that it may be something abnormal it is better to get this evaluated and know rather than pushing it off.    If you get a sudden worsening in your pain, your pain becomes severe or you have other concerns I would recommend going to [facility]  The OB/GYN's work at [facility] which is attached to [facility] and ultrasound is available typically 24/7 if needed.    Please take Ibuprofen (Advil, motrin) and Tylenol (acetaminophen) to relieve your pain.  You may take up to 600 MG (3 pills) of normal strength ibuprofen every 8 hours as needed.  In between doses of ibuprofen you make take tylenol, up to 1,000 mg (two extra strength pills).  Do not take more than 3,000 mg tylenol in a 24 hour period.  Please check all medication labels as many medications such as pain and cold medications may contain tylenol.  Do not drink alcohol while taking these medications.  Do not take other NSAID'S while taking ibuprofen (such as aleve or naproxen).  Please take ibuprofen with food to decrease stomach upset.  I would recommend eating bland foods until you are feeling back to normal.   If you develop fevers, your pain gets significantly worse, you develop abnormal discharge, urinary symptoms are unable to tolerate food or water or have any other concerns please seek additional medical care and evaluation.

## 2020-01-27 NOTE — ED Notes (Signed)
Patient transported to CT 

## 2020-01-27 NOTE — ED Notes (Signed)
Pt tolerated po fluids  

## 2020-01-27 NOTE — Telephone Encounter (Signed)
Patient called and left a message that her lower right abdomen has been hurting with a sharp, dull, throbbing pain for about 2 weeks and starting to become concerned.  I called the patient and let her know that since we have not seen her before that she should go to the ER for further evaluation. Patient stated that she thinks she is dehydrated and has been constipated and is taking a stool softener and drinking more water. Patient stated that her last BM was yesterday and verbalized an understanding to proceed to ER for further evaluation.

## 2020-01-27 NOTE — ED Triage Notes (Signed)
Pt presents with some generalized right side abdominal pain that sometime radiates to right flank area, nausea, and dizziness for past few weeks.

## 2020-01-27 NOTE — ED Triage Notes (Addendum)
Pt c/o right side abd/flank pain x 2 weeks with +nausea, constipation-denies v/d-NAD-steady gait-states she was sent from [facility]

## 2020-01-27 NOTE — Discharge Instructions (Addendum)
Your exam findings today are concerning for possible appendicitis.  I feel you warrant further work-up and evaluation in the setting of the emergency department. Please follow-up after discharge here at urgent care to either [facility] or [facility] for further work-up and evaluation to rule out an acute appendicitis.

## 2020-01-27 NOTE — ED Provider Notes (Signed)
[facility]    CSN: [id number] Arrival date & time: 01/27/20  1140      History   Chief Complaint Chief Complaint  Patient presents with  . Abdominal Pain    Right Side  . Dizziness  . Nausea    HPI Jacqueline Castaneda is a 24 y.o. female.   HPI  Patient complains of right lower abdominal pain x 2 weeks. Associated symptoms include periodic dizziness and nausea.  She characterizes pain as cramping, aching, and intensity varies.  She has both her gallbladder and her appendix however is not having any upper right quadrant pain.  Right lower quadrant pain radiates to right side of her back.  She feels that food and certain positional movement worsens the pain.  While lying still and remaining immobile improves pain.  She also reports recurrent constipation and has been taking stool softeners and feels that the pain is sometimes relieved with BMs. Denies any history of recurrent abdominal pain. She is afebrile and has remained afebrile since onset of symptoms. Past Medical History:  Diagnosis Date  . Common migraine with intractable migraine 07/13/2018  . Depression   . PTSD (post-traumatic stress disorder)   . Seizures [facility])     Patient Active Problem List   Diagnosis Date Noted  . Seizure disorder (HCC) 07/13/2018  . Common migraine with intractable migraine 07/13/2018    History reviewed. No pertinent surgical history.  OB History   No obstetric history on file.      Home Medications    Prior to Admission medications   Medication Sig Start Date End Date Taking? Authorizing Provider  carbamazepine (TEGRETOL) 200 MG tablet 1/2 tablet twice a day for 2 weeks, then take one tablet twice a day 01/12/19   York Spaniel, MD  lamoTRIgine (LAMICTAL) 150 MG tablet Take 1 tablet (150 mg total) by mouth 2 (two) times daily. 01/13/19   Glean Salvo, NP  meclizine (ANTIVERT) 25 MG tablet Take 1 tablet (25 mg total) by mouth 3 (three) times daily as needed for dizziness.  01/11/19   Mesner, Barbara Cower, MD  Multiple Vitamins-Minerals (MULTIVITAMIN ADULT PO) Take 1 tablet by mouth daily.    [provider]  buPROPion (WELLBUTRIN XL) 150 MG 24 hr tablet Take 1 tablet (150 mg total) by mouth daily. Patient not taking: Reported on 01/11/2019 08/03/18 01/11/19  Bing Neighbors, FNP  venlafaxine XR (EFFEXOR-XR) 75 MG 24 hr capsule Take 75 mg by mouth daily with breakfast.  04/29/18  [provider]    Family History Family History  Problem Relation Age of Onset  . Seizures Mother   . Seizures Maternal Grandfather     Social History Social History   Tobacco Use  . Smoking status: Never Smoker  . Smokeless tobacco: Never Used  Vaping Use  . Vaping Use: Never used  Substance Use Topics  . Alcohol use: Yes    Comment: not weekly, very occasional  . Drug use: No     Allergies   Patient has no known allergies.   Review of Systems Review of Systems Pertinent negatives listed in HPI  Physical Exam Triage Vital Signs ED Triage Vitals  Enc Vitals Group     BP 01/27/20 1245 133/72     Pulse Rate 01/27/20 1245 (!) 113     Resp 01/27/20 1245 17     Temp 01/27/20 1245 98.6 F (37 C)     Temp Source 01/27/20 1245 Temporal     SpO2  01/27/20 1245 97 %     Weight --      Height --      Head Circumference --      Peak Flow --      Pain Score 01/27/20 1244 7     Pain Loc --      Pain Edu? --      Excl. in GC? --    No data found.  Updated Vital Signs BP 133/72 (BP Location: Left Arm)   Pulse (!) 113   Temp 98.6 F (37 C) (Temporal)   Resp 17   LMP 01/12/2020   SpO2 97%   Visual Acuity Right Eye Distance:   Left Eye Distance:   Bilateral Distance:    Right Eye Near:   Left Eye Near:    Bilateral Near:     Physical Exam HENT:     Head: Normocephalic.  Eyes:     Extraocular Movements: Extraocular movements intact.  Cardiovascular:     Rate and Rhythm: Normal rate and regular rhythm.  Pulmonary:     Effort: Pulmonary  effort is normal.     Breath sounds: Normal breath sounds.  Abdominal:     General: Abdomen is protuberant. Bowel sounds are normal. There is no distension or abdominal bruit.     Tenderness: There is abdominal tenderness in the right lower quadrant. Positive signs include McBurney's sign.    Skin:    General: Skin is warm and dry.     Capillary Refill: Capillary refill takes less than 2 seconds.  Neurological:     General: No focal deficit present.     Mental Status: She is alert.  Psychiatric:        Mood and Affect: Mood normal.        Behavior: Behavior normal.     UC Treatments / Results  Labs (all labs ordered are listed, but only abnormal results are displayed) Labs Reviewed  CBC WITH DIFFERENTIAL/PLATELET  POCT URINALYSIS DIPSTICK, ED / UC  POC URINE PREG, ED    EKG   Radiology DG Abd 2 Views  Result Date: 01/27/2020 CLINICAL DATA:  Right lower quadrant abdominal pain. EXAM: ABDOMEN - 2 VIEW COMPARISON:  None. FINDINGS: The bowel gas pattern is normal. There is no evidence of free air. No radio-opaque calculi or other significant radiographic abnormality is seen. IMPRESSION: Negative. Electronically Signed   By: Lupita Raider M.D.   On: 01/27/2020 14:17    Procedures Procedures (including critical care time)  Medications Ordered in UC Medications - No data to display  Initial Impression / Assessment and Plan / UC Course  I have reviewed the triage vital signs and the nursing notes.  Pertinent labs & imaging results that were available during my care of the patient were reviewed by me and considered in my medical decision making (see chart for details).     Given positive McBurney sign along with localized right lower quadrant pain patient warrants further work-up to rule out an acute appendicitis.  Abdomen 2 view was negative for any acute finding that would explain pain.  UA was also negative.  Urine pregnancy was negative.  Advised patient to follow-up at  [facility] emergency department for further work-up and evaluation and to rule out an acute appendicitis.  Patient is stable for self transport. Final Clinical Impressions(s) / UC Diagnoses   Final diagnoses:  Right lower quadrant pain     Discharge Instructions     Your exam findings today  are concerning for possible appendicitis.  I feel you warrant further work-up and evaluation in the setting of the emergency department. Please follow-up after discharge here at urgent care to either [facility] or [facility] for further work-up and evaluation to rule out an acute appendicitis.    ED Prescriptions    None     PDMP not reviewed this encounter.   Bing Neighbors, FNP 01/27/20 1436
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
## 2020-02-22 ENCOUNTER — Encounter: Payer: No Typology Code available for payment source | Admitting: Obstetrics & Gynecology

## 2020-03-24 IMAGING — CR DG CHEST 2V
2 series · 2 of 2 positions shown · non-contrast
Comparison: Chest CTA dated 08/19/2016.

CLINICAL DATA: Chest pain.  Recent seizure.

EXAM:
CHEST - 2 VIEW

[w chest pa]
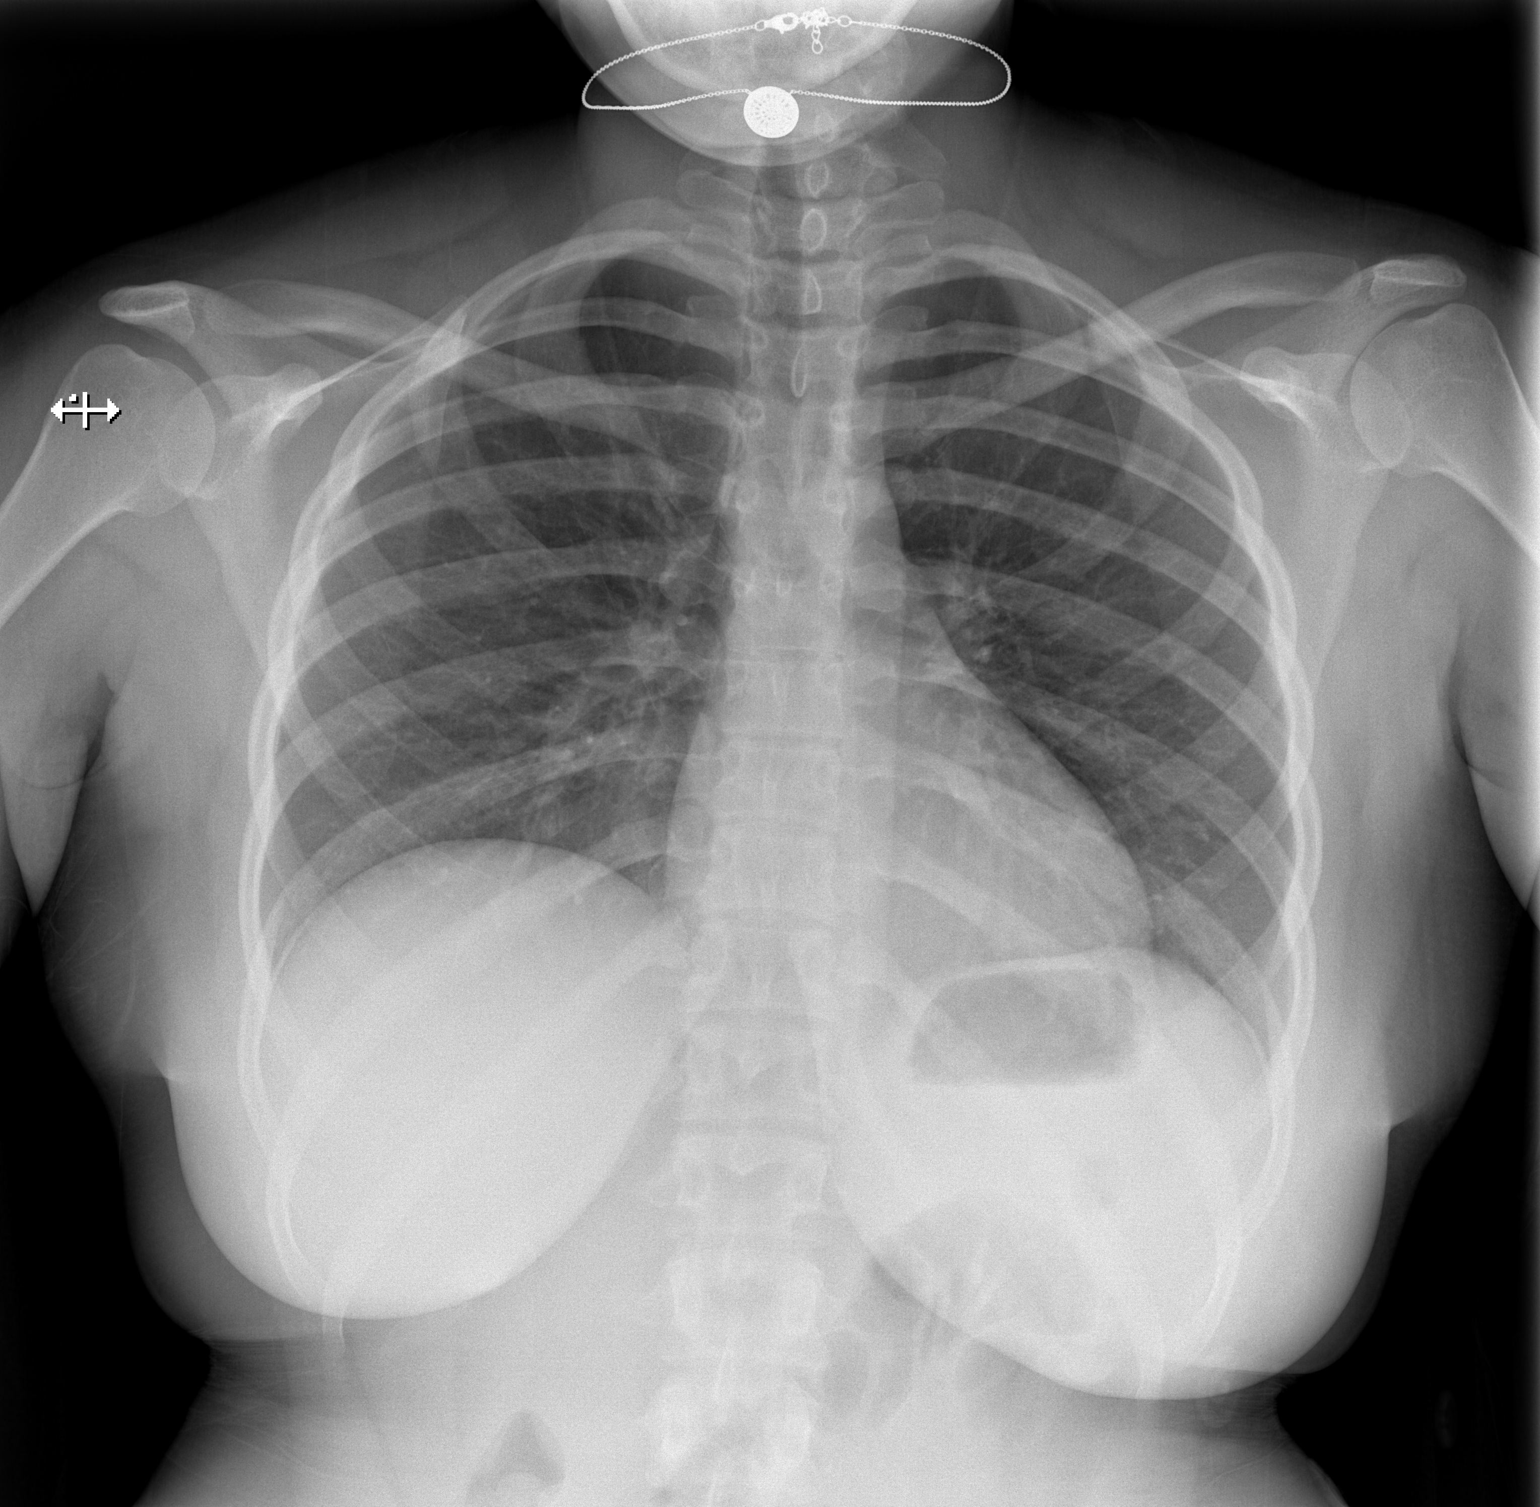

[w chest lat]
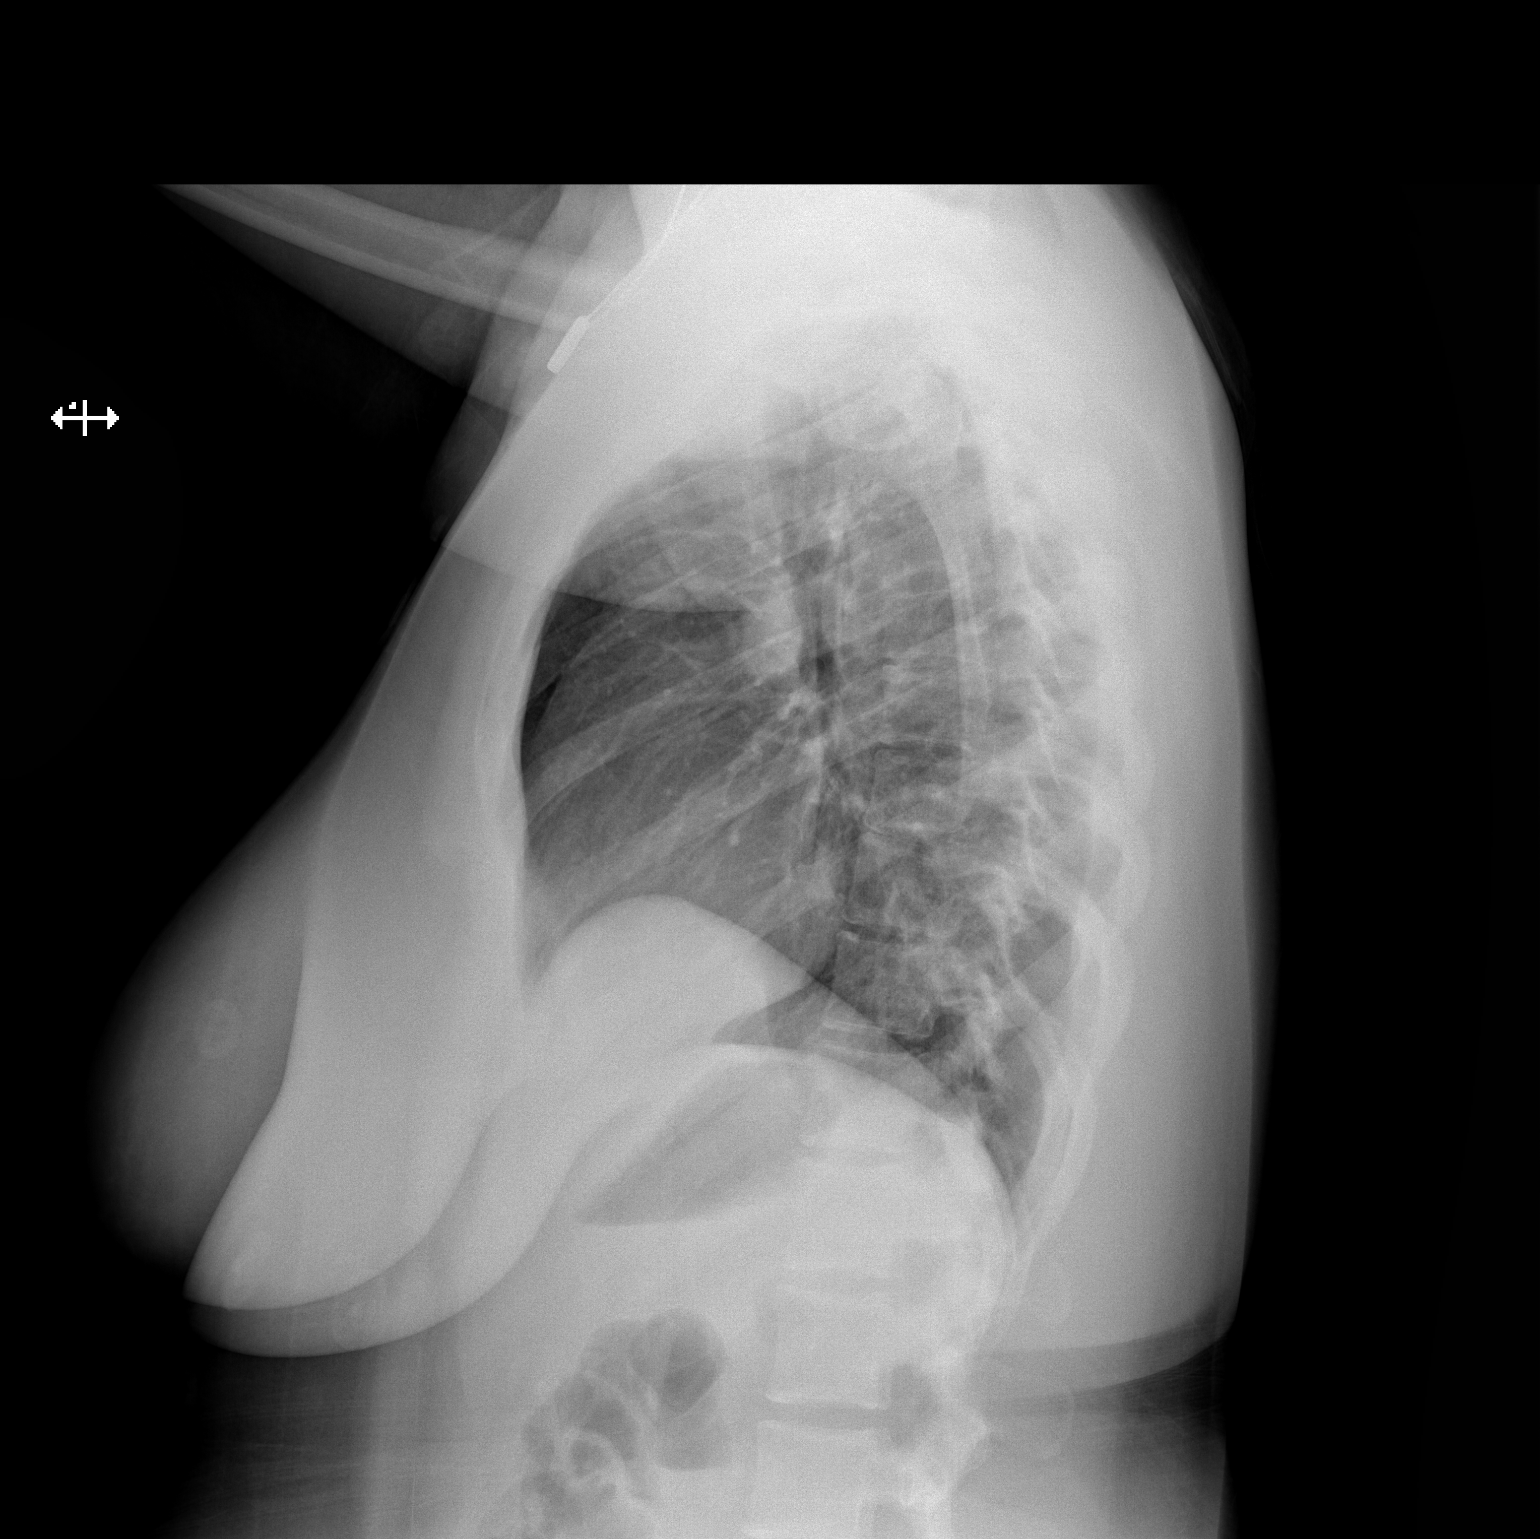

[2 of 2 positions shown; findings below may reference images not displayed]

FINDINGS: The heart size and mediastinal contours are within normal limits.
Both lungs are clear. The visualized skeletal structures are
unremarkable.
IMPRESSION: Normal examination.

## 2020-08-03 ENCOUNTER — Encounter: Payer: Self-pay | Admitting: Emergency Medicine

## 2020-08-03 ENCOUNTER — Ambulatory Visit
Admission: EM | Admit: 2020-08-03 | Discharge: 2020-08-03 | Disposition: A | Discharge status: Home / Self Care | Payer: BC Managed Care – PPO | Attending: Family Medicine | Admitting: Family Medicine

## 2020-08-03 ENCOUNTER — Other Ambulatory Visit: Payer: Self-pay

## 2020-08-03 DIAGNOSIS — S81801A Unspecified open wound, right lower leg, initial encounter: Secondary | ICD-10-CM

## 2020-08-03 DIAGNOSIS — Z23 Encounter for immunization: Secondary | ICD-10-CM

## 2020-08-03 MED ORDER — MUPIROCIN 2 % EX OINT: 1.0000 "application " | TOPICAL_OINTMENT | Freq: Two times a day (BID) | CUTANEOUS | 0 refills | Status: AC

## 2020-08-03 MED ORDER — TETANUS-DIPHTH-ACELL PERTUSSIS 5-2.5-18.5 LF-MCG/0.5 IM SUSY
0.5000 mL | PREFILLED_SYRINGE | Freq: Once | INTRAMUSCULAR | Status: AC
  Administered 2020-08-03: 0.5 mL via INTRAMUSCULAR
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
## 2020-08-03 NOTE — Discharge Instructions (Addendum)
Meds ordered this encounter  Medications   Tdap (BOOSTRIX) injection 0.5 mL   mupirocin ointment (BACTROBAN) 2 %    Sig: Apply 1 application topically 2 (two) times daily.    Dispense:  22 g    Refill:  0

## 2022-03-30 IMAGING — CT CT ABD-PELV W/ CM
2 of 4 series · 15 of 46 positions shown, 17 images · IV contrast (Omnipaque)
Comparison: CT chest of 3494

CLINICAL DATA: RIGHT lower quadrant abdominal pain

EXAM:
CT ABDOMEN AND PELVIS WITH CONTRAST
TECHNIQUE: Multidetector CT imaging of the abdomen and pelvis was performed
using the standard protocol following bolus administration of
intravenous contrast.
CONTRAST:  Eighty OMNIPAQUE IOHEXOL 300 MG/ML  SOLN

[Series 2: axial st · axial · 0.71mm/px · z∈[-498,-148]mm · 12 of 78 slices shown, 14 images]
[im 4/78  soft-tissue]
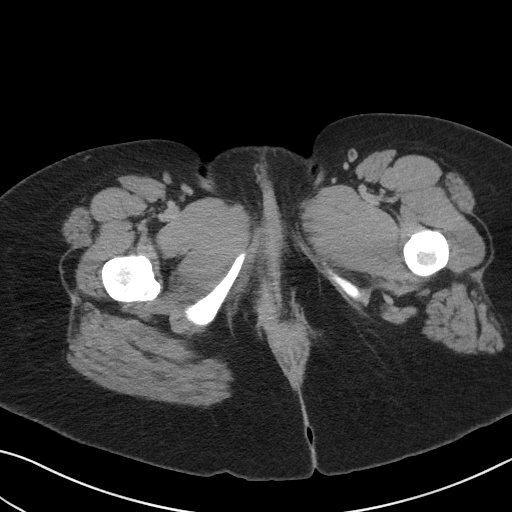
[im 4/78  bone]
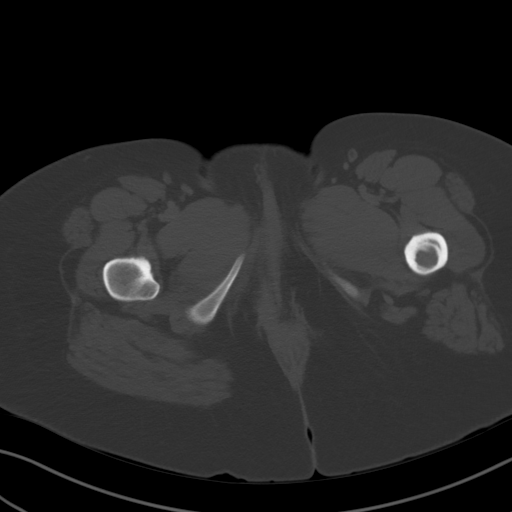
[im 11/78  soft-tissue]
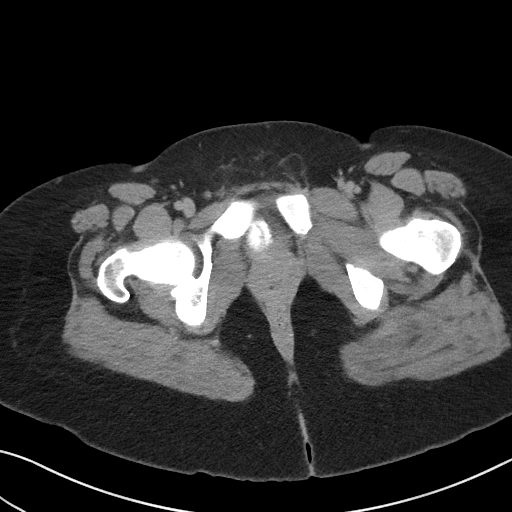
[im 18/78  soft-tissue]
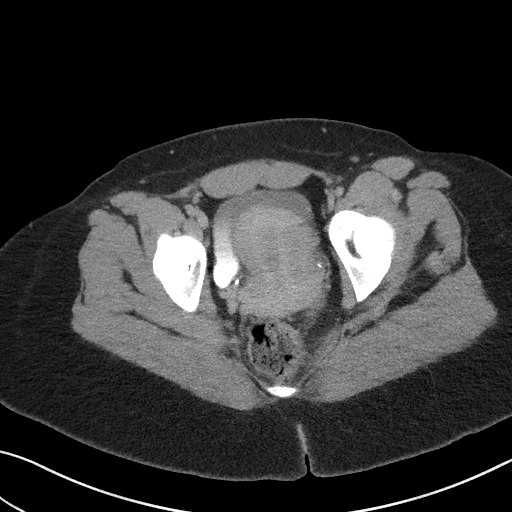
[im 25/78  soft-tissue]
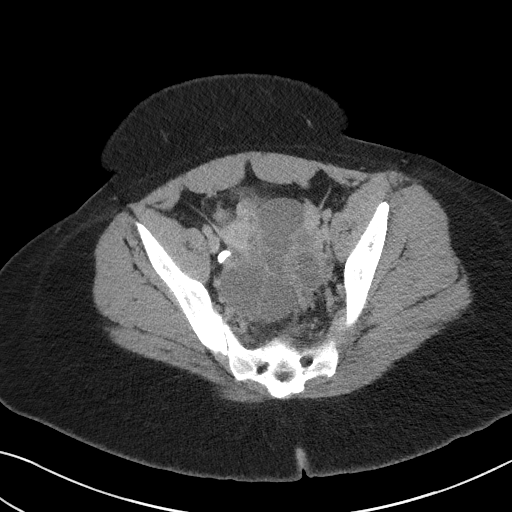
[im 29/78  soft-tissue]
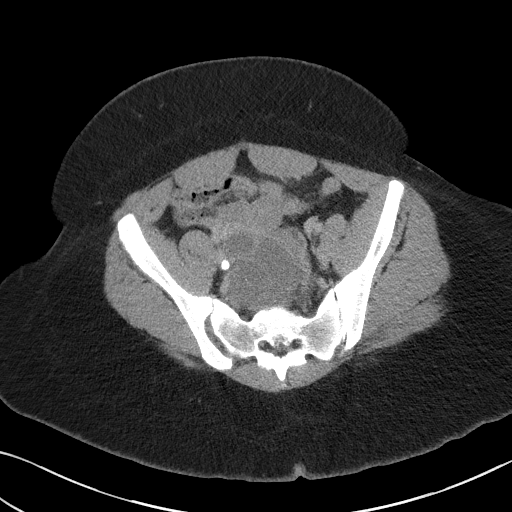
[im 36/78  soft-tissue]
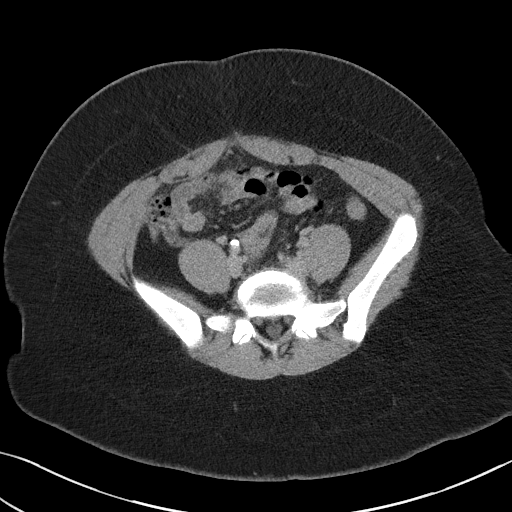
[im 43/78  soft-tissue]
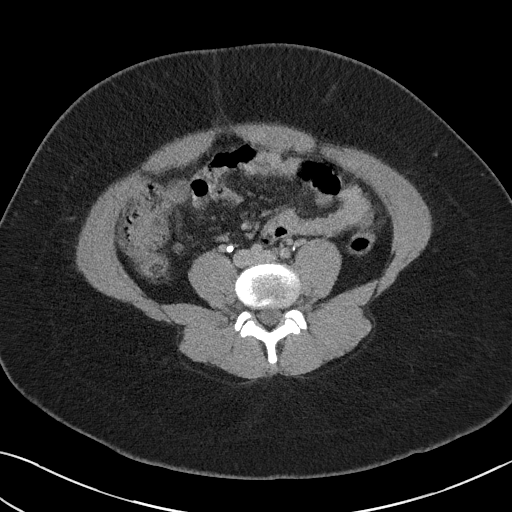
[im 50/78  soft-tissue]
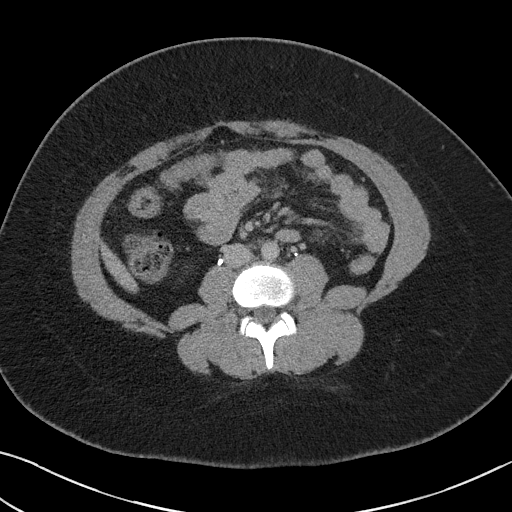
[im 53/78  soft-tissue]
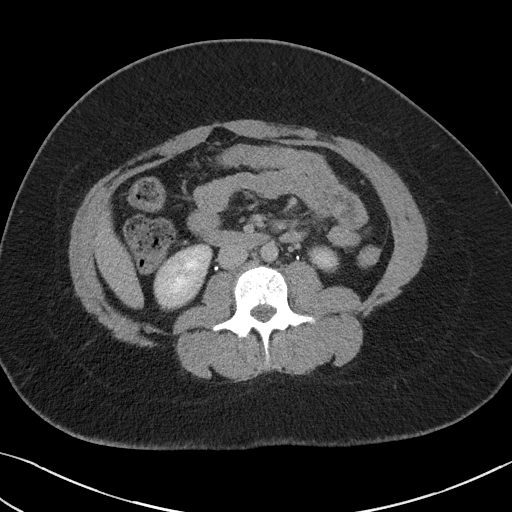
[im 53/78  bone]
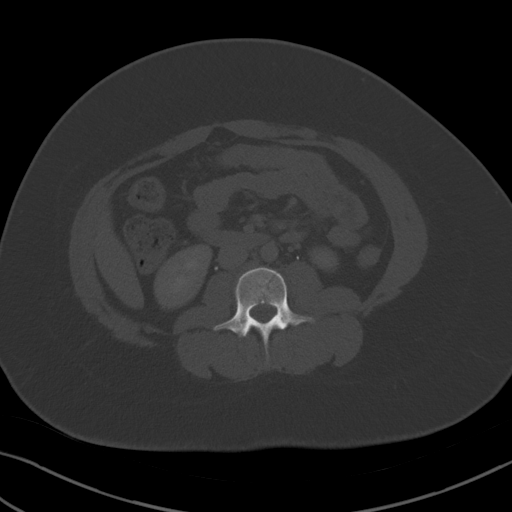
[im 60/78  soft-tissue]
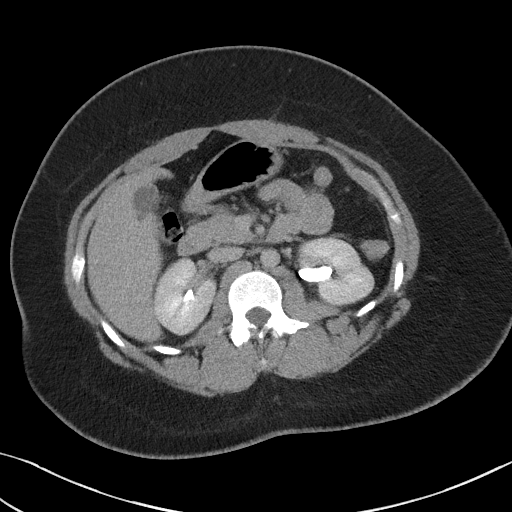
[im 67/78  soft-tissue]
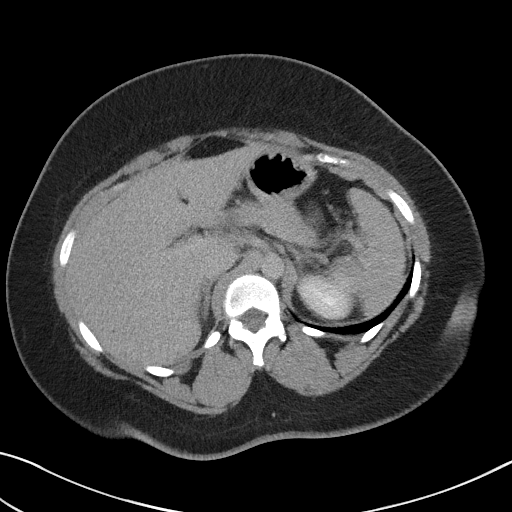
[im 74/78  soft-tissue]
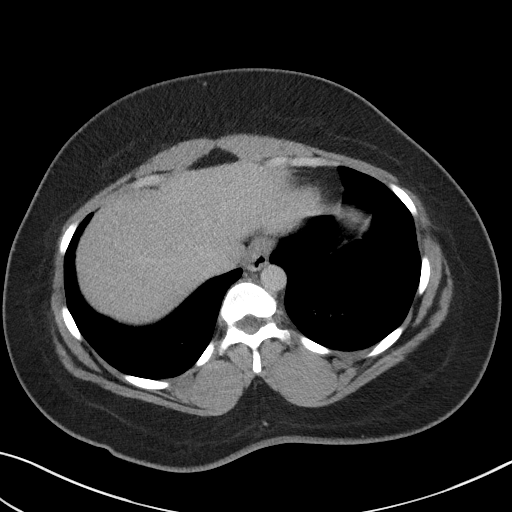

[Series 5: coronal st · coronal · 0.66mm/px · 3 of 101 slices shown]
[im 34/101  soft-tissue]
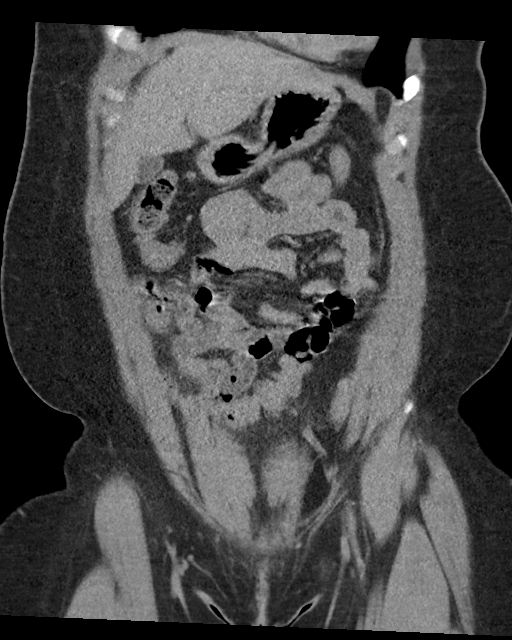
[im 45/101  soft-tissue]
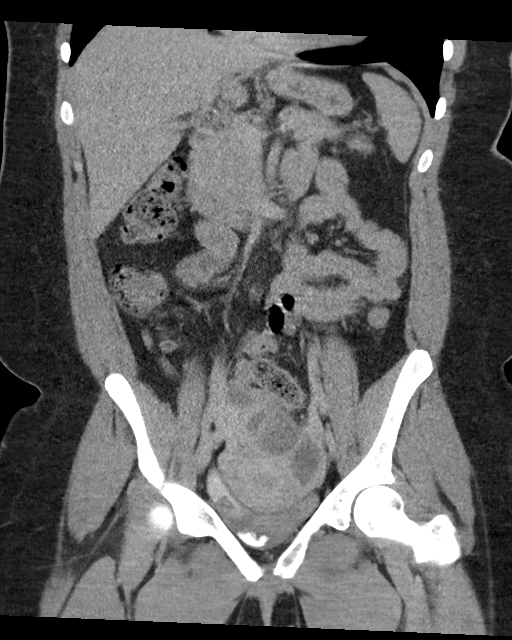
[im 56/101  soft-tissue]
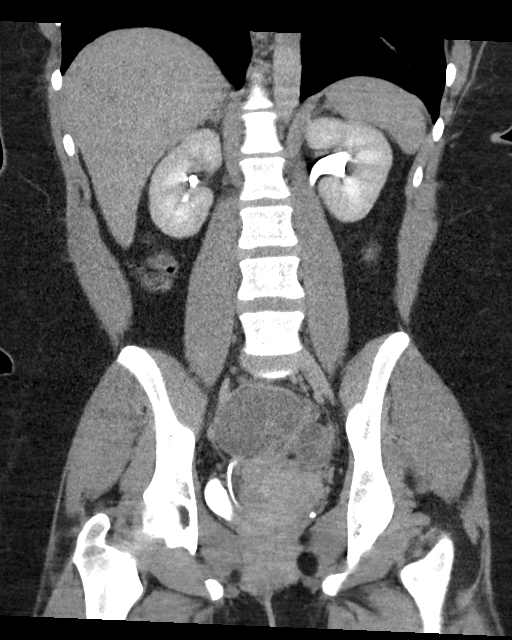

[15 of 46 positions shown; findings below may reference images not displayed]

FINDINGS: Lower chest: Lung bases are clear.  No effusion.  No consolidation.

Hepatobiliary: Liver is unremarkable. No pericholecystic stranding.
No gross biliary duct dilation.

Pancreas: Mild motion artifact with some question of mild
peripancreatic stranding versus artifact related changes in the area
of the pancreatic tail.

Spleen: Normal

Adrenals/Urinary Tract: Normal appearance of the adrenal glands.

No hydronephrosis. Symmetric renal enhancement. No hydronephrosis.
Urinary bladder is partially opacified with contrast as delayed
imaging was performed due to nausea and vomiting during the contrast
injection.

Stomach/Bowel: Appendix shows normal caliber. There is some
indistinctness about the appendix that is more likely related to
motion artifact. Appendix at 4 mm in the axial plane. No
periappendiceal fluid. Small hiatal hernia. Small bowel is of normal
caliber. Colon is under distended limiting assessment.

Vascular/Lymphatic: Limited assessment of vascular structures due to
late venous phase as outlined above. There is no gastrohepatic or
hepatoduodenal ligament lymphadenopathy. No retroperitoneal or
mesenteric lymphadenopathy.

No pelvic sidewall lymphadenopathy.

Reproductive: Cystic, multi cystic area in the RIGHT adnexa 6.1 x
6.0 cm.

LEFT adnexa with cystic changes 6.7 x 2.7 cm. Uterus unremarkable by
CT. No adjacent stranding is visualized.

Other: No free air.  No ascites.

Musculoskeletal: No acute bone finding. No destructive bone process.
IMPRESSION: 1. Cystic, multi cystic area in the RIGHT adnexa 6.1 x 6.0 cm. LEFT
adnexa with cystic changes 6.7 x 2.7 cm. No adjacent stranding is
visualized. Pelvic ultrasound may be helpful for further assessment.
Differential diagnosis would include process such as tubo-ovarian
complex, extensive pelvic endometriosis and less likely neoplasm
given patient age. No signs of inflammation in the area overtly or
adjacent fluid or stranding.
2. Mild motion artifact with some question of mild peripancreatic
stranding versus artifact related changes in the area of the
pancreatic tail. Correlate with pancreatic enzymes.
3. Appendix shows normal caliber with some motion in the area on
this portion of the examination as well but without periappendiceal
fluid is felt to be normal.
4. Under distension of the colon versus mild colitis.
5. Small hiatal hernia.
